# Patient Record
Sex: Male | Born: 1945 | Race: White | Hispanic: No | Marital: Married | Smoking: Never smoker
Health system: Southern US, Community
[De-identification: ages and names within clinical notes are randomized; demographics above are authoritative.]

## PROBLEM LIST (undated history)

## (undated) DIAGNOSIS — IMO0002 Reserved for concepts with insufficient information to code with codable children: Secondary | ICD-10-CM

## (undated) DIAGNOSIS — F32A Depression, unspecified: Secondary | ICD-10-CM

## (undated) DIAGNOSIS — E785 Hyperlipidemia, unspecified: Secondary | ICD-10-CM

## (undated) DIAGNOSIS — I779 Disorder of arteries and arterioles, unspecified: Secondary | ICD-10-CM

## (undated) DIAGNOSIS — F329 Major depressive disorder, single episode, unspecified: Secondary | ICD-10-CM

## (undated) DIAGNOSIS — I2510 Atherosclerotic heart disease of native coronary artery without angina pectoris: Secondary | ICD-10-CM

## (undated) DIAGNOSIS — R9430 Abnormal result of cardiovascular function study, unspecified: Secondary | ICD-10-CM

## (undated) DIAGNOSIS — I739 Peripheral vascular disease, unspecified: Secondary | ICD-10-CM

## (undated) DIAGNOSIS — I358 Other nonrheumatic aortic valve disorders: Secondary | ICD-10-CM

## (undated) HISTORY — DX: Peripheral vascular disease, unspecified: I73.9

## (undated) HISTORY — DX: Major depressive disorder, single episode, unspecified: F32.9

## (undated) HISTORY — DX: Other nonrheumatic aortic valve disorders: I35.8

## (undated) HISTORY — PX: CORONARY STENT PLACEMENT: SHX1402

## (undated) HISTORY — DX: Disorder of arteries and arterioles, unspecified: I77.9

## (undated) HISTORY — DX: Reserved for concepts with insufficient information to code with codable children: IMO0002

## (undated) HISTORY — DX: Depression, unspecified: F32.A

## (undated) HISTORY — DX: Hyperlipidemia, unspecified: E78.5

## (undated) HISTORY — DX: Atherosclerotic heart disease of native coronary artery without angina pectoris: I25.10

## (undated) HISTORY — PX: CARDIAC CATHETERIZATION: SHX172

## (undated) HISTORY — DX: Abnormal result of cardiovascular function study, unspecified: R94.30

---

## 2000-09-21 ENCOUNTER — Other Ambulatory Visit: Admission: RE | Admit: 2000-09-21 | Discharge: 2000-09-21 | Payer: Self-pay | Admitting: Dermatology

## 2000-10-07 ENCOUNTER — Encounter: Payer: Self-pay | Admitting: *Deleted

## 2000-10-07 ENCOUNTER — Ambulatory Visit (HOSPITAL_COMMUNITY): Admission: RE | Admit: 2000-10-07 | Discharge: 2000-10-07 | Payer: Self-pay | Admitting: *Deleted

## 2001-11-02 ENCOUNTER — Ambulatory Visit (HOSPITAL_COMMUNITY): Admission: RE | Admit: 2001-11-02 | Discharge: 2001-11-02 | Payer: Self-pay | Admitting: General Surgery

## 2002-11-27 ENCOUNTER — Encounter: Payer: Self-pay | Admitting: Internal Medicine

## 2002-11-27 ENCOUNTER — Emergency Department (HOSPITAL_COMMUNITY): Admission: EM | Admit: 2002-11-27 | Discharge: 2002-11-27 | Payer: Self-pay | Admitting: Internal Medicine

## 2003-09-06 ENCOUNTER — Inpatient Hospital Stay (HOSPITAL_COMMUNITY): Admission: RE | Admit: 2003-09-06 | Discharge: 2003-09-08 | Payer: Self-pay | Admitting: *Deleted

## 2003-11-21 ENCOUNTER — Ambulatory Visit (HOSPITAL_COMMUNITY): Admission: RE | Admit: 2003-11-21 | Discharge: 2003-11-21 | Payer: Self-pay

## 2004-07-09 ENCOUNTER — Ambulatory Visit: Payer: Self-pay | Admitting: Cardiology

## 2005-06-16 HISTORY — PX: HIP SURGERY: SHX245

## 2005-06-16 HISTORY — PX: ROTATOR CUFF REPAIR: SHX139

## 2005-06-19 ENCOUNTER — Ambulatory Visit: Payer: Self-pay | Admitting: Internal Medicine

## 2005-06-24 ENCOUNTER — Ambulatory Visit: Payer: Self-pay

## 2005-06-24 ENCOUNTER — Encounter: Payer: Self-pay | Admitting: Cardiology

## 2005-06-27 ENCOUNTER — Ambulatory Visit: Payer: Self-pay | Admitting: Cardiology

## 2005-06-30 ENCOUNTER — Ambulatory Visit (HOSPITAL_COMMUNITY): Admission: RE | Admit: 2005-06-30 | Discharge: 2005-06-30 | Payer: Self-pay | Admitting: Cardiology

## 2005-06-30 ENCOUNTER — Encounter: Payer: Self-pay | Admitting: Cardiology

## 2005-06-30 ENCOUNTER — Ambulatory Visit: Payer: Self-pay | Admitting: Cardiology

## 2005-07-04 ENCOUNTER — Inpatient Hospital Stay (HOSPITAL_COMMUNITY): Admission: RE | Admit: 2005-07-04 | Discharge: 2005-07-06 | Payer: Self-pay | Admitting: Orthopaedic Surgery

## 2005-08-25 ENCOUNTER — Ambulatory Visit: Payer: Self-pay | Admitting: Cardiology

## 2005-11-18 ENCOUNTER — Ambulatory Visit (HOSPITAL_COMMUNITY): Admission: RE | Admit: 2005-11-18 | Discharge: 2005-11-18 | Payer: Self-pay | Admitting: Family Medicine

## 2006-04-14 ENCOUNTER — Ambulatory Visit (HOSPITAL_COMMUNITY): Admission: RE | Admit: 2006-04-14 | Discharge: 2006-04-14 | Payer: Self-pay | Admitting: Family Medicine

## 2006-09-01 ENCOUNTER — Ambulatory Visit: Payer: Self-pay | Admitting: Cardiology

## 2007-06-27 IMAGING — CR DG CHEST 2V
2 series · 2 of 2 positions shown · non-contrast
Comparison: None.

CLINICAL DATA: Pre-admit for left hip surgery.
 CHEST - 2 VIEW:

[view not recorded (1 of 2)]
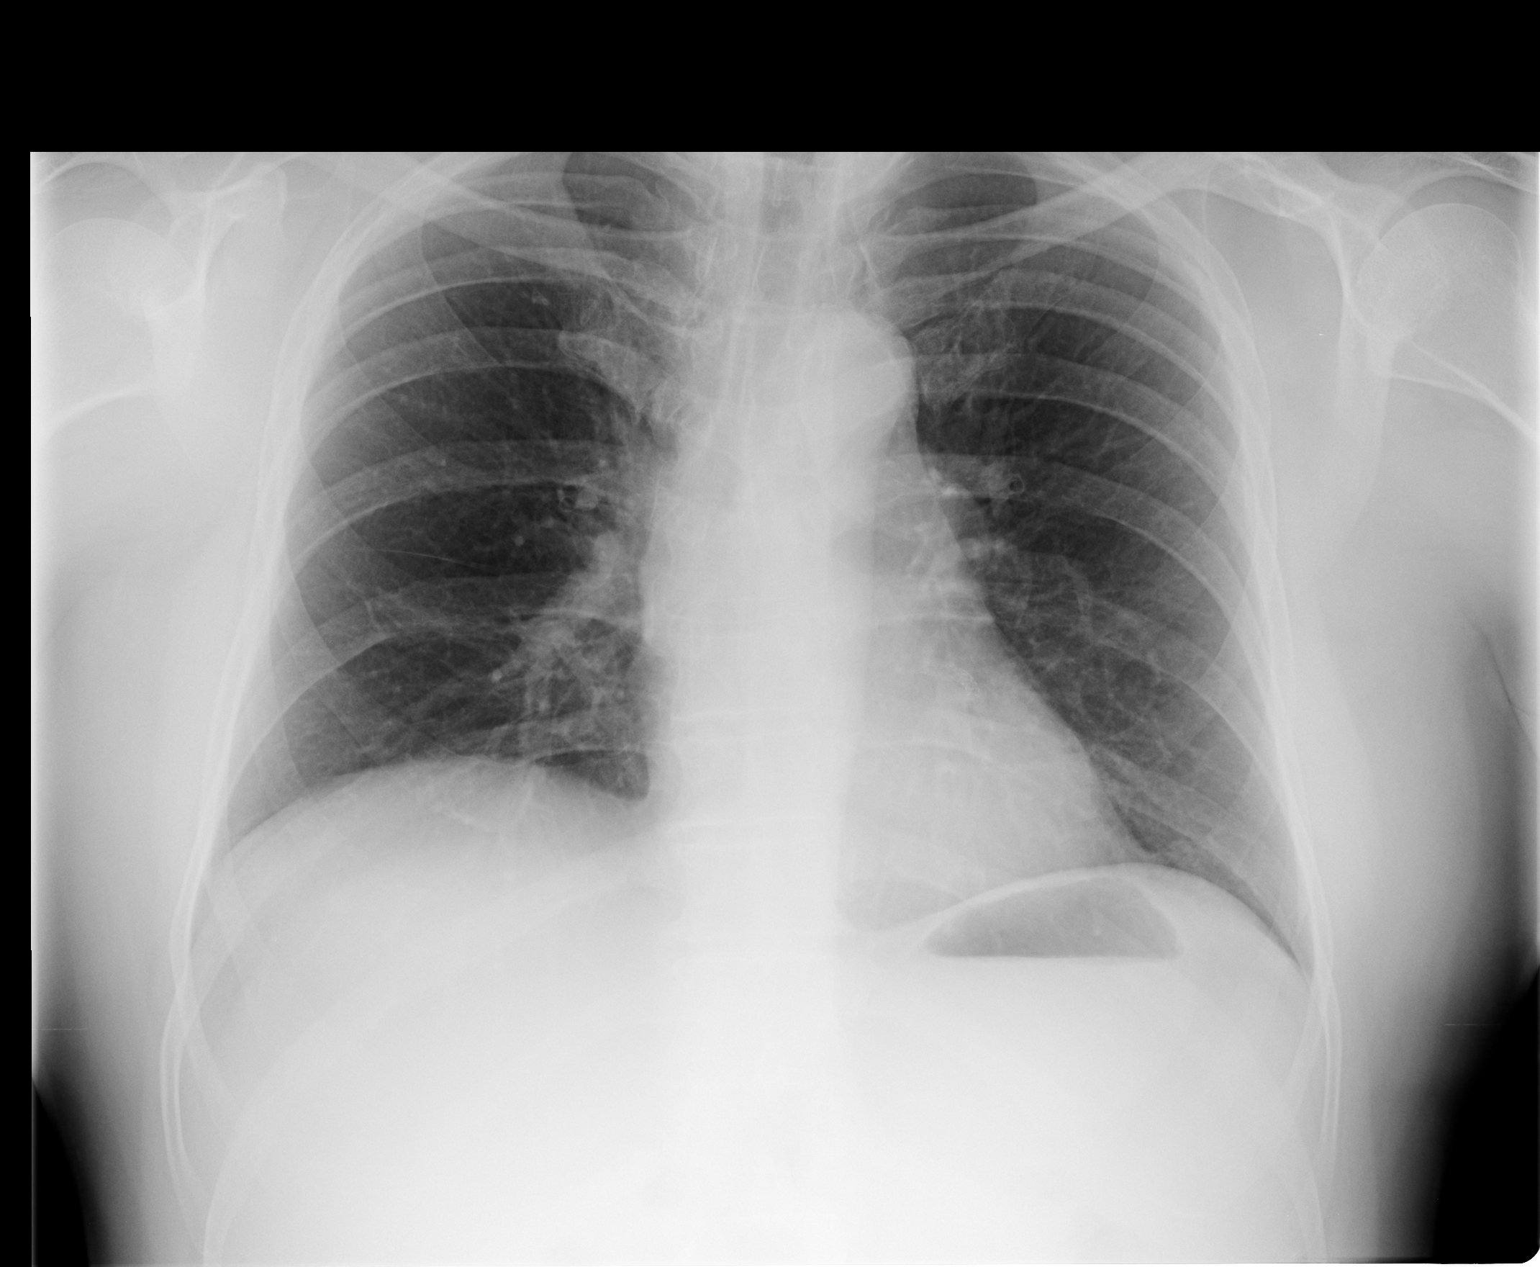

[view not recorded (2 of 2)]
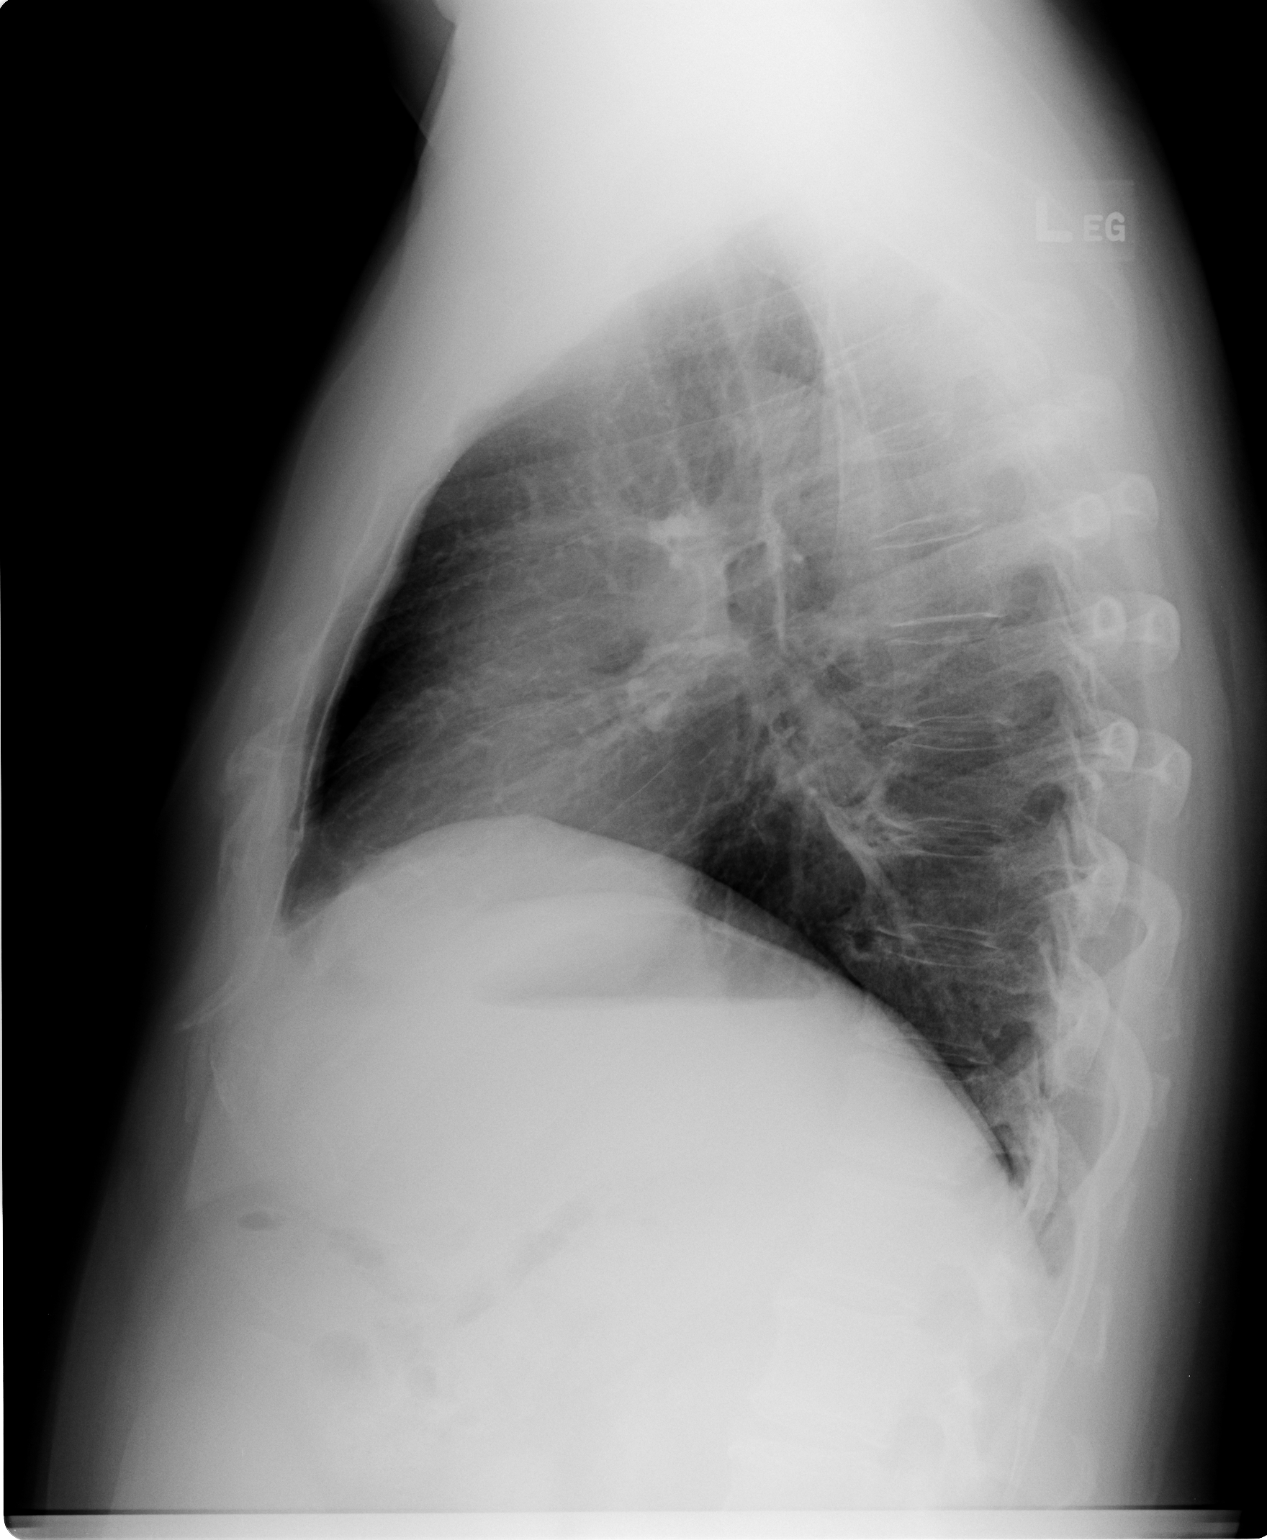

[2 of 2 positions shown; findings below may reference images not displayed]

FINDINGS: Heart normal.   There is some chronic bronchitic-type change.  There is also a left coronary artery stent.  No active disease.
IMPRESSION: 1. Chronic changes ? no acute process.
 2. Left coronary artery stent.

## 2007-10-18 ENCOUNTER — Ambulatory Visit: Payer: Self-pay | Admitting: Cardiology

## 2008-02-04 ENCOUNTER — Emergency Department (HOSPITAL_COMMUNITY): Admission: EM | Admit: 2008-02-04 | Discharge: 2008-02-05 | Payer: Self-pay | Admitting: Emergency Medicine

## 2008-02-04 ENCOUNTER — Ambulatory Visit: Payer: Self-pay | Admitting: Internal Medicine

## 2008-04-09 IMAGING — US US CAROTID DUPLEX BILAT
1 series · 14 of 24 positions shown · non-contrast
Comparison: none

HISTORY: Carotid bruit, murmur, vertigo, coronary artery disease status post MI

[Series 1: unknown · 0.07mm/px · 14 of 46 slices shown]
[im 1/46]
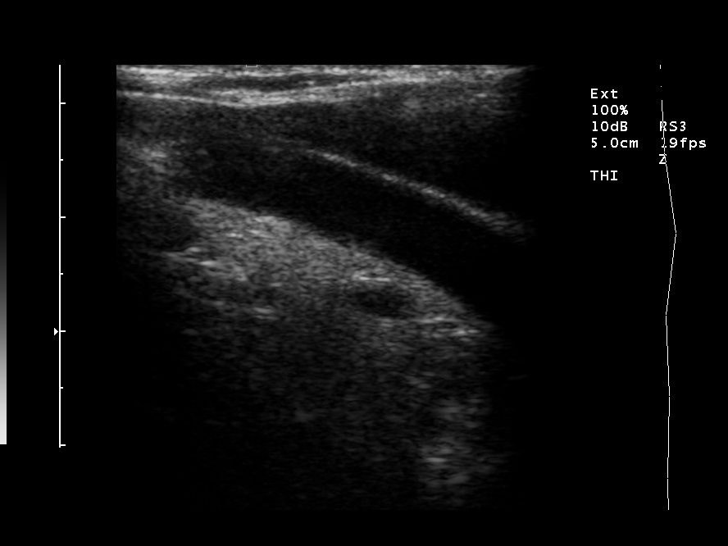
[im 4/46]
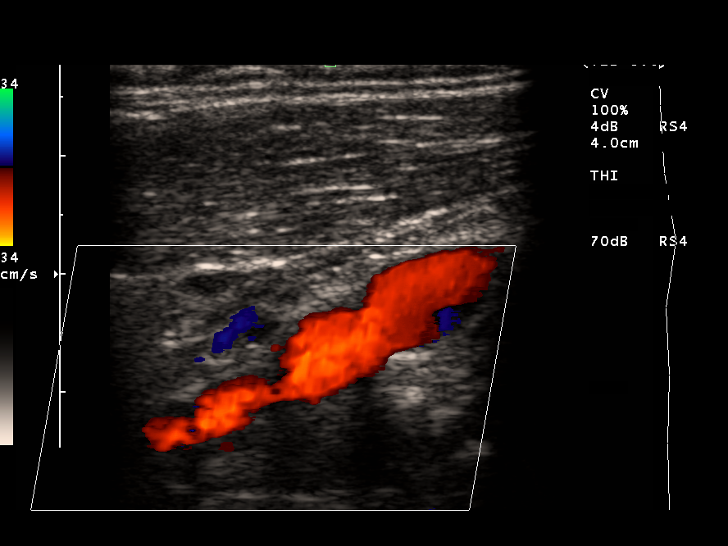
[im 8/46]
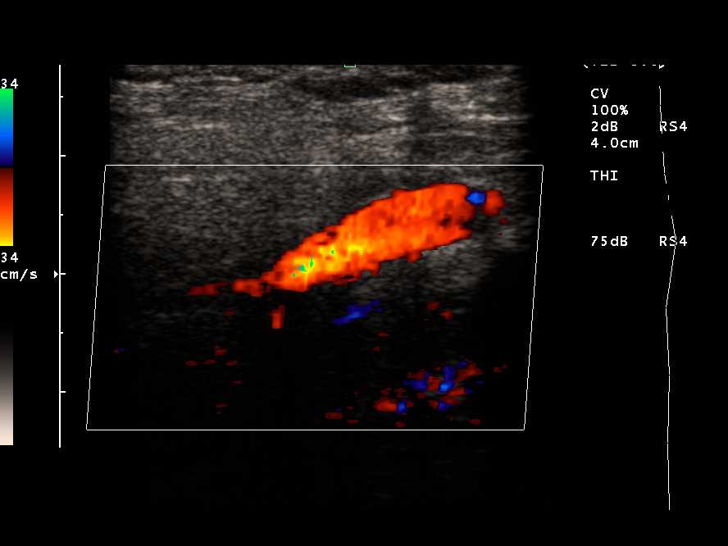
[im 12/46]
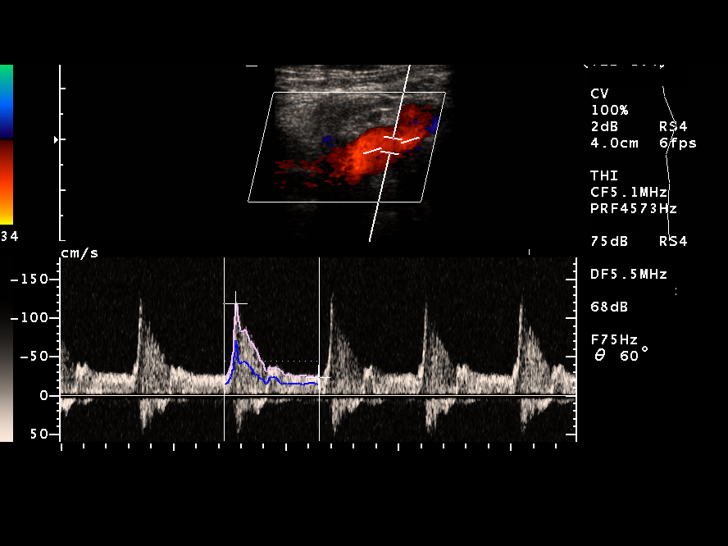
[im 14/46]
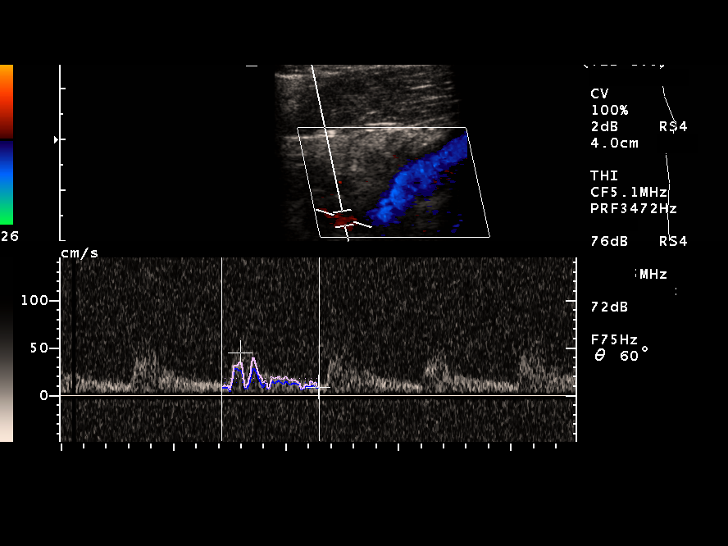
[im 18/46]
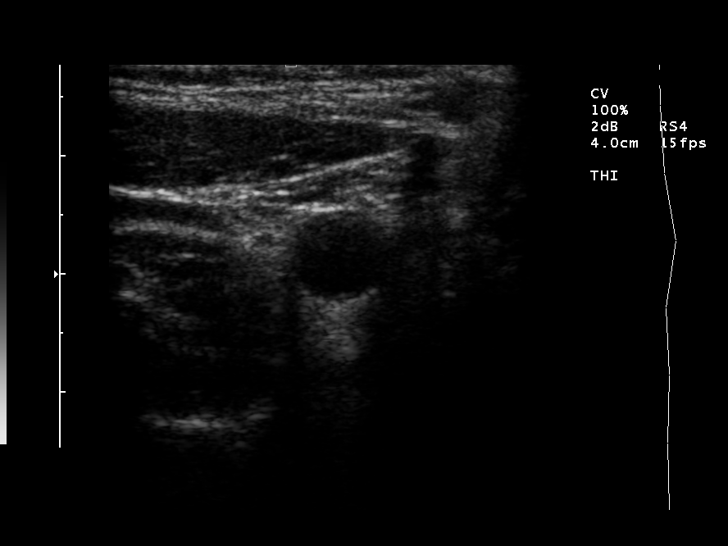
[im 22/46]
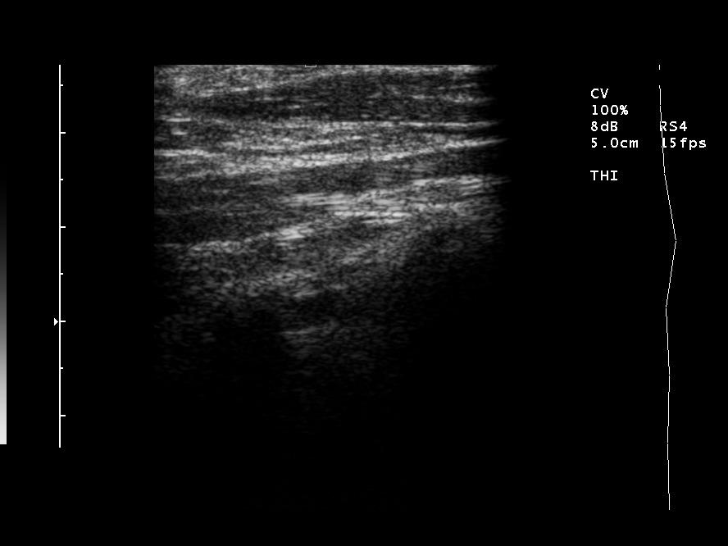
[im 24/46]
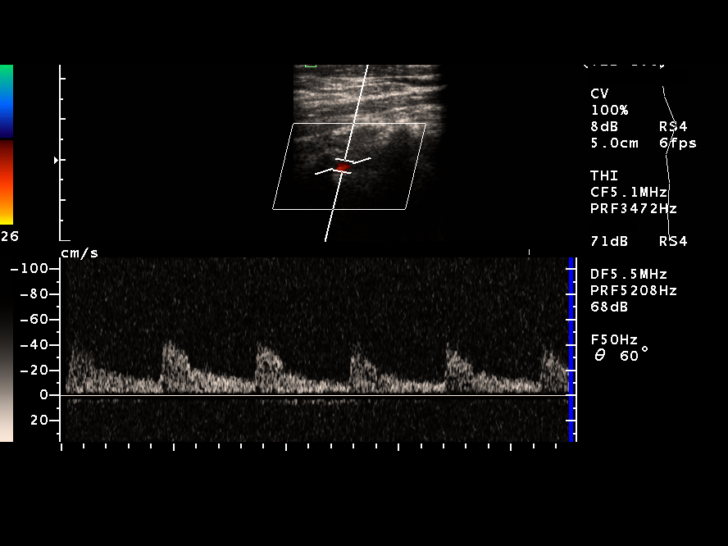
[im 28/46]
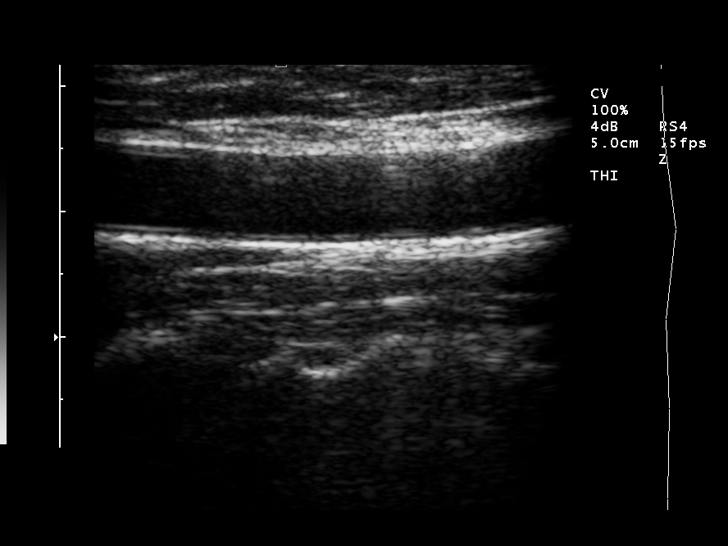
[im 32/46]
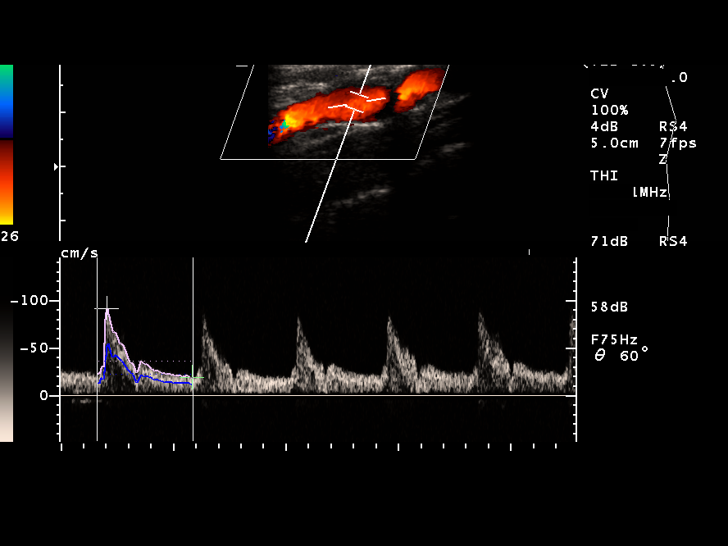
[im 36/46]
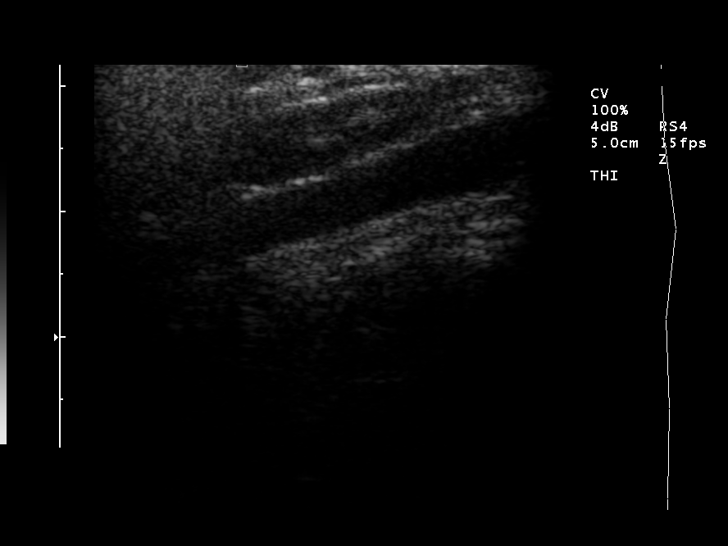
[im 38/46]
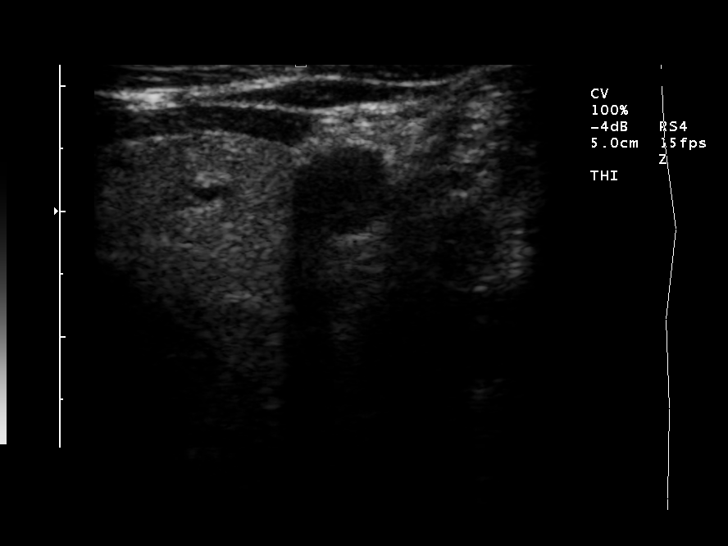
[im 42/46]
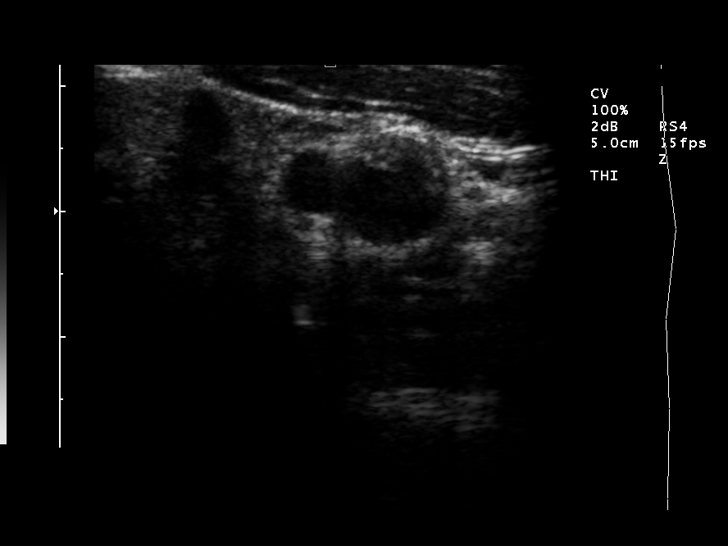
[im 46/46]
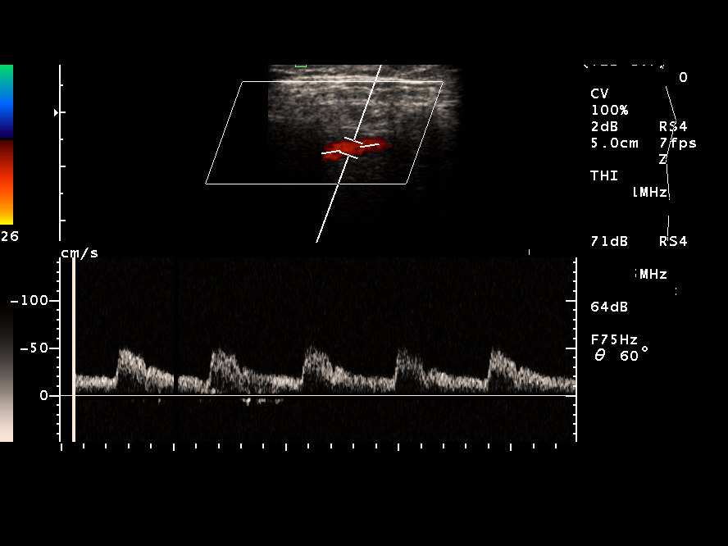

[14 of 24 positions shown; findings below may reference images not displayed]

ULTRASOUND CAROTID DUPLEX BILATERAL:

Gray scale, color Doppler, and pulse wave imaging of carotid systems performed
bilaterally. 

Intimal thickening right common carotid artery.
Plaque formation at left carotid bifurcation.
Mildly turbulent flow on color Doppler imaging and wave form analysis right
carotid bulb and bilateral ICA.
Following peak systolic velocities obtained, in cm per second:

Right CCA 78
Right ICA 118
Right ECA 102
Right ICA/CCA ratio
Right ICA EDV 23

Left CCA 66
Left ICA 91
Left ECA 89
Left ICA/CCA ratio
Left ICA EDV 19

Antegrade flow present bilateral vertebral arteries.
IMPRESSION: Intimal thickening right common carotid artery.
Plaque formation left carotid bulb and bifurcation.
Scattered turbulent flow without evidence of hemodynamically significant
stenosis.

## 2008-05-16 ENCOUNTER — Ambulatory Visit: Payer: Self-pay | Admitting: Cardiology

## 2008-09-04 ENCOUNTER — Ambulatory Visit: Payer: Self-pay | Admitting: Psychology

## 2008-12-22 ENCOUNTER — Encounter: Payer: Self-pay | Admitting: Cardiology

## 2008-12-25 ENCOUNTER — Encounter: Payer: Self-pay | Admitting: Cardiology

## 2008-12-25 ENCOUNTER — Ambulatory Visit: Payer: Self-pay

## 2008-12-25 ENCOUNTER — Ambulatory Visit: Payer: Self-pay | Admitting: Cardiology

## 2009-10-31 ENCOUNTER — Encounter (INDEPENDENT_AMBULATORY_CARE_PROVIDER_SITE_OTHER): Payer: Self-pay | Admitting: *Deleted

## 2010-02-28 ENCOUNTER — Ambulatory Visit: Payer: Self-pay | Admitting: Cardiology

## 2010-04-16 HISTORY — PX: MELANOMA EXCISION: SHX5266

## 2010-07-07 ENCOUNTER — Encounter: Payer: Self-pay | Admitting: Family Medicine

## 2010-07-16 NOTE — Letter (Signed)
Summary: Appointment - Reminder 2  [facility], Main Office  [address]   [place], [place] [address]   Phone: [phone]  Fax: [phone]     Oct 31, 2009 MRN: [id number]   Brandon Macias [address] [place], [place]  [address]   Dear Mr. Kruger,  Our records indicate that it is time to schedule a follow-up appointment with Dr. Myrtis Ser. It is very important that we reach you to schedule this appointment. We look forward to participating in your health care needs. Please contact us at the number listed above at your earliest convenience to schedule your appointment.  If you are unable to make an appointment at this time, give [place] a call so we can update our records.     Sincerely,   Migdalia Dk [facility] Scheduling Team

## 2010-07-16 NOTE — Assessment & Plan Note (Signed)
Summary: past due fu due injuly/mt   Visit Type:  Follow-up Primary Provider:  Assunta Found, MD  CC:  CAD.  History of Present Illness: The patient is seen for followup of coronary artery disease.  I saw him last July, 2010.  He is doing very well.  He's not had any significant chest pain.  He had some mild dizziness recently.  We discussed this carefully and it does not sound like cardiac ischemia.  He's going to full activities.  His diabetes has been out of control and he is working very carefully with Dr.Golding to get this back under control.  He is eating better and exercising more.  Current Medications (verified): 1)  Crestor 40 Mg Tabs (Rosuvastatin Calcium) .... Take One Tablet By Mouth Daily. 2)  Janumet 50-500 Mg Tabs (Sitagliptin-Metformin Hcl) .... Two Times A Day 3)  Mirtazapine 15 Mg Tabs (Mirtazapine) .... Take One Tablet By Mouth Once Daily. 4)  Trazodone Hcl 100 Mg Tabs (Trazodone Hcl) .... Take One Tablet By Mouth Once Daily. 5)  Aspirin 81 Mg Tbec (Aspirin) .... Take One Tablet By Mouth Daily 6)  Cozaar 100 Mg Tabs (Losartan Potassium) .... Take One Tablet By Mouth Daily  Allergies (verified): No Known Drug Allergies  Past History:  Past Medical History: DM Hyperlipidemia Hip surgery and redo hip in January of 2007 Normal left ventricular function. CAD...  tandem drug-eluting stents to the LAD March 2005. Jailed diagonal at that time with a small controlled MI. Repeat catheterization January 2007.. old occluded diagonal. Stents were patent.(Myoview scan at that time revealed some anterior ischemia..from the old diagonal). Aortic valve sclerosis .Marland Kitchen2D 12/25/2008. Marland Kitchen.EF 60%.  mild aortic valve sclerosis .Marland Kitchen..echo... July, 2010  Review of Systems       Patient denies fever, chills, headache, sweats, rash, change in vision, change in hearing, chest pain, cough, nausea vomiting, urinary symptoms.  All other systems are reviewed and are negative.  Vital  Signs:  Patient profile:   65 year old male Height:      68 inches Weight:      189 pounds BMI:     28.84 Pulse rate:   67 / minute BP sitting:   108 / 76  (left arm) Cuff size:   regular  Vitals Entered By: Hardin Negus, RMA (February 28, 2010 8:59 AM)  Physical Exam  General:  patient is stable in general. Eyes:  no xanthelasma. Neck:  no jugular venous distention. Lungs:  lungs are clear respiratory effort is not labored. Heart:  cardiac exam reveals S1-S2.  There is a 2/6 systolic murmur. Abdomen:  abdomen is soft. Extremities:  no peripheral edema. Psych:  patient is oriented to person time and place.  Affect is normal.   Impression & Recommendations:  Problem # 1:  * AORTIC VALVE SCLEROSIS His  aortic valve sclerosis is mild by echo in July, 2010.  Problem # 2:  CAD (ICD-414.00)  His updated medication list for this problem includes:    Aspirin 81 Mg Tbec (Aspirin) .Marland Kitchen... Take one tablet by mouth daily  Orders: EKG w/ Interpretation (93000) Coronary disease stable.  EKG is done today and reviewed by me.  There is no significant abnormality.  No further workup.  Treating his diabetes aggressively will be the most important thing going forward.  Problem # 3:  HYPERLIPIDEMIA (ICD-272.4)  His updated medication list for this problem includes:    Crestor 40 Mg Tabs (Rosuvastatin calcium) .Marland Kitchen... Take one tablet by mouth daily. Lipids are  being treated.  No change in therapy.  Followup in one year.  Patient Instructions: 1)  Your physician wants you to follow-up in:  1 year.  You will receive a reminder letter in the mail two months in advance. If you don't receive a letter, please call our office to schedule the follow-up appointment.

## 2010-10-29 NOTE — Assessment & Plan Note (Signed)
[facility]                            CARDIOLOGY OFFICE NOTE   GIRARD, KOONTZ                        MRN:          [id number]  DATE:10/18/2007                            DOB:          1946-03-26    Mr. Zunker continues to do very well.  He had an intervention to his LAD  in 2005.  In 2007, he was cleared of for his hip surgery.  We had  decided at that time, after careful consideration, for him to undergo  cath by Dr. Juanda Chance in January of 2007.  He has tandem drug-eluting  stents to the LAD and there is a jailed diagonal.  We know this  historically.  Dr. Regino Schultze study showed the occluded diagonal and that  the stents were widely patent.  He has now stopped his Plavix since last  year.  He had been on Plavix for 3 years.  We know that his Myoview  still shows some mild anterior ischemia and this could be from the  occluded diagonal.  He has not had a Myoview since 2007.  The patient  also has mild aortic valve sclerosis.  He has not had an echo for at  least 3 to 4 years if not longer and we will arrange for him to have an  echo before I see him next year.   PAST MEDICAL HISTORY:   ALLERGIES:  No known drug allergies.   MEDICATIONS:  1. Vytorin 10/40.  2. Aspirin 81.  3. Metformin 500 b.i.d.   OTHER MEDICAL PROBLEMS:  See the list below.   REVIEW OF SYSTEMS:  He is feeling well.  He is not having any headache  or eye problems.  He has no GI or GU symptoms.  He has been with his  girlfriend 14 years and they have become engaged to be married on  March 25, 2008.   Otherwise review of systems is negative.   PHYSICAL EXAM:  Blood pressure is 128/88 with a pulse of 71.  The patient is oriented to person, time and place.  Affect is normal.  HEENT:  Reveals no xanthelasma.  He has normal extraocular motion.  There are no carotid bruits.  There is no jugular venous distention.  Lungs are clear.  Respiratory effort is not labored.  CARDIAC:   Exam reveals S1 with an S2.  The patient has a 2/6 crescendo-  decrescendo systolic murmur.  The abdomen is soft.  There are no masses or bruits.  He has no peripheral edema.   EKG is normal.   PROBLEMS:  1. Borderline diabetes.  The patient now is on metformin.  2. Abnormal cholesterol on Vytorin.  We will ask Dr. Phillips Odor for a      copy of his labs.  3. Status post hip surgery and redo hip in January of 2007 and he is      doing very well.  4. Normal left ventricular function.  5. Coronary disease.  See the discussion above.  6. Aortic valve sclerosis and he will have an echo before I see him in  the spring of 2010.   He is doing well.     Luis Abed, MD, [facility]  Electronically Signed    JDK/MedQ  DD: 10/18/2007  DT: 10/18/2007  Job #: [id number]   cc:   Corrie Mckusick, M.D.

## 2010-10-29 NOTE — Consult Note (Signed)
NAMEJANCARLOS, Brandon Macias   MEDICAL RECORD NO.:  [id number]          PATIENT TYPE:  EMS   LOCATION:  [facility]                         FACILITY:  [facility]   PHYSICIAN:  Bevelyn Buckles. Bensimhon, MDDATE OF BIRTH:  [birth year]   DATE OF CONSULTATION:  02/04/2008  DATE OF DISCHARGE:                                 CONSULTATION   PRIMARY CARE PHYSICIAN:  Corrie Mckusick, MD   CARDIOLOGIST:  Everardo Beals. Juanda Chance, MD, [facility]   REASON FOR CONSULTATION:  Dizziness and tingling.   Mr. Brandon Macias is a very pleasant 65 year old male.  His fiancee actually  works with Dr. Tora Perches brother.  Mr. Brandon Macias has a history of  hyperlipidemia, borderline diabetes, and coronary artery disease.  He is  status post tandem overlapping Cypher drug-eluting stent to the LAD in  March 2005 by Dr. Gerri Spore.  This procedure was complicated by a side  branch occlusion of the diagonal resulting in a small periprocedural  infarct.  His anginal symptoms at that time were mostly dyspnea.  In  January 2007, he underwent repeat catheterization due to an abnormal  Myoview that showed a widely patent LAD stent.  The diagonal was totally  occluded with some collaterals.  Otherwise, left circumflex and right  coronary artery were perfectly normal.   He has been in his normal state of health until about 2 weeks ago when  he started to develop dizziness and weakness.  This is sort of off and  on to the past 2 weeks.  He says he has been under an incredible amount  of stress at work.  Today, he was out walking with his fiancee and felt  very dizzy and had a stop.  He denied any chest pain or shortness of  breath.  He has not had any change in his exercise tolerance.  About an  hour later, he was sitting on a couch and he developed some nausea and  became very jittery, so Dr. Myrtis Ser was contacted and he came to the ER for  me to evaluate.   He did say that he does feel swimmy headed, but denies any syncope.  He  has  not had any significant visual changes.  He has not noticed any  muscle weakness or facial droop.  There has been no aphasia or other  problems with his speech.  He has not had any headaches or focal  weakness.   Remainder of review of systems is notable for insomnia and arthritis  pain.  All other systems are negative except for HPI and problem list.   PROBLEM LIST:  1. Coronary artery disease.      a.     Status post overlapping Cypher drug-eluting stent to left       anterior descending artery in March 2005 complicated by a side       branch occlusion and small periprocedural infarct due to loss of a       diagonal branch.  He has normal left ventricular function.  2. Hyperlipidemia.  3. Borderline diabetes, this has improved since he  lost weight.  4. Intention tremor.  5. Insomnia.   MEDICATIONS:  1. Vytorin 10/40.  2. Aspirin 81.  3. Ambien p.r.n.   ALLERGIES:  No known drug allergies.   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  He is engaged to be married.  He does not  smoke.   FAMILY HISTORY:  Noncontributory.   PHYSICAL EXAMINATION:  GENERAL:  He is somewhat jittery and nervous-  appearing.  He is in no acute distress.  VITAL SIGNS:  Blood pressure is 142/78, heart rate 68.  His saturations  are in the high 90s.   HEENT:  Normal.  NECK:  Supple.  There is no JVD.  Carotids are 2+ bilaterally without  bruits.  There is no lymphadenopathy or thyromegaly.  CARDIAC:  PMI is nondisplaced.  He is regular with 2/6 systolic ejection  murmur at the right sternal border.  S2 is well preserved.  LUNGS:  Clear.  ABDOMEN:  Soft, nontender, nondistended.  No hepatosplenomegaly.  No  bruits.  No mass.  EXTREMITIES:  Warm with no cyanosis, clubbing, or edema.  No rash.  NEURO:  He is alert and oriented x3.  Cranial nerves II through XII are  grossly intact.  Moves all four extremities without difficulty.  There  is no  pronator drift.  He does have a mild intention tremor, which is  chronic on finger-to-nose exam.  It is otherwise unremarkable.  He has a  negative Romberg.  Affect is mildly stressed.   Labs and EKG are pending.   ASSESSMENT:  1. Dizziness, weakness, and nausea.  2. History of coronary artery disease as described above.  3. Borderline diabetes.   PLAN/DISCUSSION:  I suspect this is a stress reaction, but I do think it  is reasonable to check baseline labs and make sure there is nothing  wrong with his glucose or thyroid.  We will also check an EKG and give  him one dose of Xanax here in the ER.  If all the tests are negative, we  will likely discharge him with anxiolytic, so that he can follow up with  his primary care physician.  We did briefly discussed the possibility of  a head CT, but we have agreed that this is probably not necessary at  this point unless his symptoms persist.  We will wait for the results of  his labs.      Bevelyn Buckles. Bensimhon, MD  Electronically Signed     DRB/MEDQ  D:  02/04/2008  T:  02/05/2008  Job:  [id number]   cc:   Corrie Mckusick, M.D.

## 2010-10-29 NOTE — Assessment & Plan Note (Signed)
[facility]                            CARDIOLOGY OFFICE NOTE   KEATS, KINGRY                        MRN:          [id number]  DATE:05/16/2008                            DOB:          [birth year]    Mr. Friberg is doing well.  He had an intervention in 2005.  In 2007, we  evaluated him fully before he had some surgery.  He had a mild area of  anterior ischemia and we did proceed with catheterization.  His drug-  eluting stents were patent.  He had the old totally occluded jailed  diagonal.  This probably gives him some mild ischemia, but he has no  symptoms.  He had no other significant findings.  He has remained  stable.   Recently, he had a significant panic attack related to work in other  situations.  He was seen in the emergency room and this resolved and he  feels much better.  He is very appreciative that our team came to see  him immediately to help.   PAST MEDICAL HISTORY:   ALLERGIES:  No known drug allergies.   MEDICATIONS:  Vytorin, aspirin, Lunesta, and metformin.   OTHER MEDICAL PROBLEMS:  See the list below.   REVIEW OF SYSTEMS:  He is not having any GI or GU symptoms.  He does  have an upper respiratory infection at this time.  Otherwise, his review  of systems is negative.   PHYSICAL EXAMINATION:  VITAL SIGNS:  Blood pressure is 130/80 with a  pulse of 82.  GENERAL:  The patient is oriented to person, time and place.  Affect is  normal.  HEENT:  No xanthelasma.  He has normal extraocular motion.  NECK:  There are no carotid bruits.  There is no jugular venous  distention.  LUNGS:  Clear.  Respiratory effort is not labored.  CARDIAC:  An S1 with an S2.  There is a 2-3/6 systolic murmur.  ABDOMEN:  Soft.  EXTREMITIES:  He has no peripheral edema.   No labs were done today.   PROBLEMS:  1. Diabetes being treated.  2. Abnormal cholesterol being treated.  3. Status post hip surgery and redo hip surgery in January 2007,      stable.  4. Normal left ventricular function.  5. Coronary artery disease as outlined above with tandem stents in the      left anterior descending and a jailed diagonal that is old.      Residual mild inferior ischemia from the jailed diagonal.  6. Aortic valve sclerosis.  It is now time for followup echo to assess      his murmur and be sure      he is not developing significant aortic stenosis.  This will be      done and I will see him back in 6 months.     Luis Abed, MD, [facility]  Electronically Signed    JDK/MedQ  DD: 05/16/2008  DT: 05/17/2008  Job #: [id number]   cc:   Corrie Mckusick, M.D.

## 2010-10-29 NOTE — Consult Note (Signed)
Brandon Macias, Brandon Macias NO.:  [id number]   MEDICAL RECORD NO.:  [id number]          PATIENT TYPE:  EMS   LOCATION:  [facility]                         FACILITY:  [facility]   PHYSICIAN:  Bevelyn Buckles. Bensimhon, MDDATE OF BIRTH:  25-Aug-1945   DATE OF CONSULTATION:  02/04/2008  DATE OF DISCHARGE:                                 CONSULTATION   PRIMARY CARE PHYSICIAN:  Corrie Mckusick, M.D.   CARDIOLOGIST:  Luis Abed, MD, [facility] and actually now Everardo Beals.  Juanda Chance, MD, [facility].   REASON FOR CONSULTATION:  Dizziness and arm tingling.   HISTORY OF PRESENT ILLNESS:  Brandon Macias is a very pleasant 65 year old  male, whose fiancee works with Dr. Henrietta Macias brother.  He has a history of  hyperlipidemia, borderline diabetes and coronary artery disease.  He is  status post tandem overlapping Cypher drug-eluting stents to the LAD in  2005 by Dr. Gerri Spore.  This procedure was complicated by the jailing of  a diagonal branch and a periprocedural infarct.  He had a repeat  catheterization in January 2007, after an abnormal preoperative Myoview;  this showed patent LAD stents with the totally occluded diagonals, which  filled by collaterals.  The right coronary and left circumflex were  normal.  LV function was normal.   He says he was in his usual state of health until about 2 weeks ago,  when he began to develop dizziness, arm tingling and weakness.  He says  he has been under an incredible amount of stress at work.  He noticed  that his symptoms were a little bit worse today.  He was out walking  with his fiancee, when he became dizzy and had to stop.  About an hour  later he was sitting on the couch relaxing and developed some nausea,  but no chest pain or shortness of breath and no vomiting.  He felt very  jittery and so decided to come to the emergency room for evaluation.   REVIEW OF SYSTEMS:  He denies any change in his functional capacity or  exercise tolerance.  He has not had any focal  neurological symptoms.  He  denies any problems with his speech.  No problems swallowing.  No  problems with his strength.  He has not had any headaches.  No  significant visual changes.  No palpitations.  No syncope or presyncope.  No fevers or chills.  The remainder of the review of systems is as per  HPI and problem list, otherwise all systems were negative.   PROBLEM LIST:  1. Coronary artery disease.  Status post overlapping Cypher drug-      eluting stent to the LAD in 2005.  Relook catheterization in 2007      looked good.  2. Hyperlipidemia.  3. Intention tremor.  4. Borderline diabetes, which improves with weight loss.  5. Insomnia.   CURRENT MEDICATIONS:  1. Vytorin 10/40.  2. Aspirin 81.  3. Ambien.   ALLERGIES:  NO KNOWN DRUG ALLERGIES.   SOCIAL HISTORY:  He is engaged to be married.  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  He does not smoke  cigarettes.   FAMILY HISTORY:  Noncontributory.   PHYSICAL EXAMINATION:  He is mildly anxious.  Blood pressure is 142/98,  heart rate 68.  He is satting in the high 90s on room air.  HEENT:  Normal.  NECK:  Supple.  No JVD.  Carotids were 2+ bilateral without bruits.  There was no lymphadenopathy or thyromegaly.  CARDIAC:  PMI is nondisplaced.  He had a regular rate and rhythm with a  2/6 systolic ejection murmur at the right sternal border.  S2 is well-  preserved.  LUNGS:  Clear.  ABDOMEN:  Soft, nontender, nondistended.  No hepatosplenomegaly and no  bruits.  No masses appreciated.  Good bowel sounds.  EXTREMITIES:  Warm with no cyanosis, clubbing or edema.  No rash.  NEUROLOGIC:  He is alert and oriented x3.  Cranial nerves II-XII are  intact.  Moves all four extremities without difficulty.  His strength is  normal in all extremities.  Pronator drift is negative.  There is no  facial droop.  Romberg is negative.  He does have a mild intention  tremor on finger-to-nose examination,  which he says is chronic.  Affect  is, once again, mildly jittery.   EKG and labs are pending.   ASSESSMENT:  1. Dizziness, weakness and nausea.  2. History of coronary artery disease, as described above.   PLAN:  I suspect the symptoms are stress reaction.  However, given his  history, I do think it is reasonable to check baseline labs, including  his blood sugar, a TSH, electrolytes and a CBC.  We will also check an  EKG.  I have given him one dose of Xanax here in the ER.  If all his  labs are negative, we can consider discharging him home with a low dose  anxiolytic.  Of note, we did discuss the possibility of doing a head CT  here in the ER; but, given the lack of focal findings, we have decided  that this is probably not necessary at this time.  If his symptoms  persist he can get one as an outpatient.      Bevelyn Buckles. Bensimhon, MD  Electronically Signed     DRB/MEDQ  D:  02/04/2008  T:  02/04/2008  Job:  [id number]   cc:   Corrie Mckusick, M.D.  Luis Abed, MD, [facility]

## 2010-11-01 NOTE — Procedures (Signed)
NAMESTROTHER, EVERITT NO.:  [id number]   MEDICAL RECORD NO.:  [id number]          PATIENT TYPE:  OUT   LOCATION:  RAD                            FACILITY:   PHYSICIAN:  Dani Gobble, MD       DATE OF BIRTH:  October 08, 1945   DATE OF PROCEDURE:  04/15/2006  DATE OF DISCHARGE:                                  ECHOCARDIOGRAM   REFERRING:  Dr. Phillips Odor.   INDICATIONS:  A 65 year old gentleman with vertigo and a carotid bruit who  is referred for cardiac murmur.   Technical quality of the study is somewhat limited secondary to patient body  habitus and poor acoustic windows.   The aorta appears normal in size, measured at 3.8 cm.   The left atrium also is measured normally at 3.6 cm.  The patient appeared  to be in sinus rhythm during the procedure.   The interventricular septum and posterior wall were mild to moderately  thickened.   The aortic valve is probably trileaflet, although the left coronary cusp is  not well visualized.  The leaflets appear mild to moderately thickened and  calcified with minimal limitation to leaflet excursion.  No significant  aortic insufficiency is noted.  Doppler interrogation of the aortic valve  reveals peak velocity of 2.2 m per second corresponding to a peak gradient  of 20 mmHg and a mean gradient of 11 mmHg with an area by Doppler of 2.2  cm2.   The mitral valve appears mildly thickened without limitation of leaflet  excursion.  No obvious mitral valve prolapse is appreciated.  There does  appear to be a lax chordae with chordal SAM present.  The possibility of a  possible vegetation cannot be entirely excluded; however, the location and  appearance is more consistent with lax chordae.  Mild mitral regurgitation  is noted.  Doppler interrogation of the mitral valve is within normal  limits.   The pulmonic valve appears grossly structurally normal.   Tricuspid valve also appears grossly structurally normal although not  well  visualized.  Mild tricuspid regurgitation is noted.   The left ventricle appears grossly normal in size with the LVIDD measured at  3.5 cm and LVISD measured at 2.6 cm.  Overall left ventricular systolic  function is normal.  In one view only, there appeared to be some  dyssynchrony, but this was not appreciated in any other view.  The overall  ejection fraction is normal.   The right atrium appears normal in size.  The right ventricle appears  moderately dilated with preserved right ventricular systolic function.  I  cannot exclude the possibility of a small anterior pericardial effusion;  however, this is more likely pericardial fat pad.  No hemodynamic compromise  is appreciated.   IMPRESSION:  1. Mild to moderate concentric left ventricular hypertrophy.  2. Aortic sclerosis with very mild aortic stenosis is present.  3. Mildly thickened mitral valve with lax chordae and chordal SAM:  Doubt      vegetation given location and appearance  4. Mild mitral and tricuspid regurgitation.  5.  Normal left ventricular size and overall ejection fraction.  In one      view only, there was some suggestion of dyssynchrony; however, this was      not appreciated in any other view, and overall ejection fraction is      normal.  6. Mild to moderate right ventricular enlargement with preserved right      ventricular systolic function.  7. Small anterior echo-free space which likely represents precardial fat      pad, although a small pericardial effusion without evidence of      hemodynamic compromise can not be entirely excluded.           ______________________________  Dani Gobble, MD     AB/MEDQ  D:  04/15/2006  T:  04/16/2006  Job:  [id number]

## 2010-11-01 NOTE — Cardiovascular Report (Signed)
Brandon Macias, Brandon Macias NO.:  [id number]   MEDICAL RECORD NO.:  [id number]          PATIENT TYPE:  OIB   LOCATION:  2899                         FACILITY:  [facility]   PHYSICIAN:  Charlies Constable, M.D. [facility] DATE OF BIRTH:  03-09-1946   DATE OF PROCEDURE:  DATE OF DISCHARGE:                              CARDIAC CATHETERIZATION   MEDICAL HISTORY:  Mr. Ferreras is 65 years old and works for a cigarette company  in [place].  He had a tandem overlying Cypher stent placed in the LAD in  March of 2005 by Dr. Gerri Spore.  The procedure was complicated by its side  branch occlusion which resulted in a small periprocedural  infarct.  He is  doing quite well since that time and he had a Cardiolite scan done recently  to evaluate him preoperatively for revision hip surgery.  This suggested  anterior ischemia and Dr. Myrtis Ser brought him in for evaluation of angiography.   PROCEDURE:  The procedure was performed via the right femoral artery and  arterial sheath in using a 6-French preformed coronary catheters.  A front  wall arterial puncture was prominent and opaque contrast was used.  We used  an AL-1 for injection of the right coronary since it had a high anterior  takeoff.  The right femoral artery was closed with __________  end of the  procedure.  The patient tolerated the procedure well and left the laboratory  in satisfactory condition.   RESULTS:  The left main coronary was free of disease.   The left anterior descending artery gave rise to 3 perforators.  There were  tandem overlying Cypher stents in the proximal LAD which had 0% stenosis.  There was a diagonal which arose from the LAD at the site of the stent which  was totally occluded and a small caliber vessel filled by collaterals from a  ramus branch.   The circumflex artery __________  gave rise to a ramus branch, a marginal  branch, a posterolateral branch and a posterior descending branch.  These  vessels were free of  significant disease.   The right coronary artery had a small __________  right ventricular  branches.  These vessels were free of significant disease.   The left ventricular and __________  coronary artery objection showed good  wall motion with no areas of hypokinesis, with an estimated ejection  fraction of 60%.   CONCLUSION:  Coronary artery status post prior percutaneous coronary  interventions as described above with 0% stenosis at the stent sites in the  proximal LAD, total occlusion on the first diagonal branch of the LAD, no  significant obstruction of the circumflex and the right coronary artery and  normal LV function.   RECOMMENDATIONS:  The patient has an occluded diagonal branch which is old  and occurred at the time of the previous intervention.  This probably  accounts for his abnormal scan.  This is not causing any clinical symptoms.  I do not think any further treatment is needed for this.  I think the  patient is okay to proceed with hip  revision surgery on Friday.  I spoke  with Dr. Ophelia Charter and he is okay with our leaving the patient on Plavix and  aspirin through the  surgery.  We will also begin the patient on Toprol XL 25 mg a day prior to  surgery and continue this for at least several days after the surgery.  This  prescription was given to the patient by Dr. Myrtis Ser.   The patient does not have valvular heart disease and should not need FB  prophylaxis.           ______________________________  Charlies Constable, M.D. [facility]     BB/MEDQ  D:  06/30/2005  T:  06/30/2005  Job:  [id number]   cc:   Loraine Leriche C. Ophelia Charter, M.D.  Fax: [phone]   Willa Rough, M.D.  [address]  [address]  [place]  [place] [address]   Corrie Mckusick, M.D.  Fax: [phone]

## 2010-11-01 NOTE — Discharge Summary (Signed)
NAMEDEMAREE, LIBERTO NO.:  [id number]   MEDICAL RECORD NO.:  [id number]          PATIENT TYPE:  INP   LOCATION:  5023                         FACILITY:  [facility]   PHYSICIAN:  Mark C. Ophelia Charter, M.D.    DATE OF BIRTH:  1945/12/19   DATE OF ADMISSION:  07/04/2005  DATE OF DISCHARGE:  07/06/2005                                 DISCHARGE SUMMARY   FINAL DIAGNOSES:  1.  Total hip arthroplasty, polyethylene wear with early loosening.  2.  New onset diabetes.   PROCEDURE:  Left total hip arthroplasty polyethylene and ball exchange.   SURGEON:  Mark C. Ophelia Charter, M.D.   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  Total hip was done in 1992 and he has been treated for the last  year or two with anti-inflammatories and Fosamax due to some polyethylene  debris and wear and now is having increased pain with radiographic evidence  of eccentric poly-wear and some early loosening.   ADMISSION MEDICATIONS:  1.  Plavix one a day.  2.  Vytorin 10/40 one p.o. daily.  3.  Voltaren 75 mg b.i.d.  4.  Aspirin 81 mg one p.o. daily.   HOSPITAL COURSE:  The patient was admitted and underwent polyethylene  exchange, liner exchange and change of the metal ball.  There was  significant polyethylene debris present.  New locking ring, new liner, new  metal head were placed.  Postoperatively x-rays looked good.  He made rapid  progress and was discharged home two days after surgery improved.   DISCHARGE MEDICATIONS:  1.  Plavix one a day.  2.  Vytorin 10/40 one p.o. daily.  3.  Voltaren 75 mg one b.i.d.  4.  Aspirin 81 mg one p.o. daily.  5.  Tylox for pain.  6.  He is on Coumadin daily which will be regulated for an INR of 2.0 to 3.0      and he will be on this for one month.   LABORATORY DATA:   Admission hemoglobin 11.4, discharge 10.6 and he did have  some elevated glucose at 159, 173.   PLAN:  His hyperglycemia will be evaluated by his primary care physician.  Copy of laboratory was given to him for review.  Hip wound cultures were  negative which were intraoperative.      Mark C. Ophelia Charter, M.D.  Electronically Signed     MCY/MEDQ  D:  08/08/2005  T:  08/08/2005  Job:  [id number]

## 2010-11-01 NOTE — Cardiovascular Report (Signed)
NAME:  Brandon Macias, Brandon Macias                           ACCOUNT NO.:  [id number]   MEDICAL RECORD NO.:  [id number]                   PATIENT TYPE:  OIB   LOCATION:  2903                                 FACILITY:  [facility]   PHYSICIAN:  Carole Binning, M.D. [facility]         DATE OF BIRTH:  [birth year]   DATE OF PROCEDURE:  09/06/2003  DATE OF DISCHARGE:                              CARDIAC CATHETERIZATION   PROCEDURES PERFORMED:  1. Left heart catheterization with coronary angiography and left     ventriculography.  2. Percutaneous transluminal coronary angioplasty with placement of drug     eluting stents times two in the proximal to mid left anterior descending     artery.   INDICATIONS:  Brandon Macias is a 65 year old male with multiple cardiac risk  factors.  He has been experiencing atypical chest pain.  A recent stress  Cardiolite scan showed significant ischemia in the anterior, anteroseptal  and apical region of the left ventricle.  He therefore is referred for  cardiac catheterization.   CATHETERIZATION PROCEDURAL NOTE:  A 6-French sheath was placed in the right  femoral artery.  Left coronary arteriography was performed with a 6-French  JL4 catheter.  The right coronary artery was a small non-dominant vessel,  which had a high anterior origin off of the aorta.  This was nonselectively  injected with an AR-2 catheter.  Left ventriculography was performed with an  angled pigtail catheter.  Contrast was Omnipaque.  There were no  complications.   CATHETERIZATION RESULTS:   HEMODYNAMICS:  1. Left ventricular pressure:  132/18.  2. Aortic pressure:  132/85.  3. There is no aortic valve gradient.   LEFT VENTRICULOGRAM:  Wall motion is normal.  Ejection fraction is estimated  at 65%.  There is no significant mitral regurgitation.   CORONARY ARTERIOGRAPHY (LEFT DOMINANT):  1. Left main is normal.  2. Left anterior descending artery has a calcified, tubular 90% stenosis in     the proximal  vessel.  This extends across the origin of a small to normal     sized diagonal branch and a septal perforator.  The LADs are otherwise     normal, supplying the anterior apical wall.  3. Left circumflex is a large dominant vessel.  Circumflex gives rise to a     normal sized first obtuse marginal, small second and third obtuse     marginal branches, normal sized first posterior lateral branch and a     normal sized posterior descending artery.  The first obtuse marginal     branch has a 50% stenosis proximally.  4. Right coronary artery is a small nondominant vessel.  The right coronary     artery appears normal.   IMPRESSIONS:  1. Normal left ventricular systolic function.  2. One-vessel coronary artery disease, characterized by a complex, tubular     90% stenosis in the proximal left anterior descending artery extending  across the diagonal branch of the septal perforator.  There is moderate,     but non-obstructive disease in the obtuse marginal branch.   RECOMMENDATION:  These findings were discussed with the patient.  It was  explained that there is significant disease in the proximal LAD involving a  diagonal branch.  After discussion of the risks and benefits of percutaneous  coronary intervention including possible side branch occlusion, the patient  opted to proceed with percutaneous coronary intervention.   PTA PROCEDURAL NOTE:  We utilized the preexisting 6-French sheath in the  right femoral artery.  Heparin and Integrilin were administered per  protocol.  We used a 6-French JL4 guiding catheter.  A BMW wire was advanced  under fluoroscopic guidance into the distal LAD.  We then performed PTCA of  the proximal LAD with a 2.5 x 15 mm Quantum balloon for two inflations, the  first to 8 atmospheres and the second to 12 atmospheres.   We then positioned a 3.0 x 18 mm Cypher drug eluting stent across the  diseased segment of vessel.  This stent did go across the origins of  the  diagonal branch and the septal perforator.  There was some difficulty in  advancing the stent due to calcification, but we were able to position it  adequately.  This stent was deployed at 12 atmospheres.  Angiographic images  at that point revealed significant improvement.  However, there was residual  narrowing within the stent due to significant calcification.  At that point,  the diagonal branch of the septal perforator remained patent.   We then went back in with a 3.0 x 15 mm Quantum balloon positioned in the  distal aspect of the stent inflating it to 14 atmospheres and in the  proximal aspect of the stent inflating it to 20 atmospheres.  Angiographic  images at that point revealed an excellent result of his stent site.  However, the diagonal branch was now 100% occluded with TIMI-0 flow.   In addition, just beyond the stent, there appeared to be an edge dissection,  which was relatively localized and small.  We advanced a PT-2 light support  wire through the side struts of the stent in an attempt to recanalize the  diagonal branch.  We actually did advance the stent into the diagonal branch  and it appeared to be in the correct location.  We advanced a 2.0 x 15 mm  Maverick balloon over the wire into the diagonal branch and it did pass very  easily.  We performed one inflation with this balloon to 6 atmospheres.  Angiographic images at that point, however, revealed persistence of  occlusion of the diagonal branch with no flow in the vessel.  Multiple doses  of nitroglycerin were administered and then we did begin to see some flow in  the diagonal branch and it was apparent that there was a significant spiral  dissection in the diagonal branch beginning at its origin.  The coronary  guide wire appeared most likely to be in the dissection plane.  We then  attempted to advance a Hi-Torque Floppy wire followed by an [employer] wire followed by a traverse wire.  However, none  of these were able to cross  the side struts of the stent into the true lumen of the diagonal branch.  We  then pulled back our PT-2 wire and attempted to redirect it into the true  lumen of the diagonal branch.  However, we were unsuccessful.  We therefore turned our attention back to the LAD.  We advanced a 3.0 x 13  mm Cypher drug eluting stent into the mid LAD to cover the edge dissection  from the initial stent.  There was overlap with the first stent.  We  deployed our stent at 9 atmospheres.  We then went in with a 3.0 x 15 mm  Quantum balloon, positioned it in the distal aspect of the stent inflating  it to 12 atmospheres and then pulled it back slightly and inflated it to 15  atmospheres.   Final angiographic images were obtained revealing patency to the LAD of the  stent site with 0% residual stenosis at the stent site and TIMI-3 flow into  the distal vessel.  There was TIMI-1 flow into the diagonal branch with  evidence of spiral dissection.  The patient was hemodynamically stable at  that point with minimal chest discomfort.   COMPLICATIONS:  Occlusion of the diagonal side branch, as described above.   RESULTS:  1. Successful placement of drug eluting stents times two in the proximal to     the mid left anterior descending artery.  2. A calcified tubular 90% stenosis was reduced to 0% residual with TIMI-3     flow.  3. As described above, the diagonal branch was occluded with TIMI-1 flow in     the diagonal branch at the conclusion of the procedure.   PLAN:  1. The patient will be continued on Integrilin for 18 hours.  It is     recommended that he be treated with Plavix for six to nine months.  2. We will cycle cardiac enzymes to assess his degree of myocardial damage     from loss of the side branch.  3. The patient will also require aggressive risk factor modification.                                               Carole Binning, M.D. [facility]    MWP/MEDQ  D:   09/06/2003  T:  09/06/2003  Job:  [id number]   cc:   Corrie Mckusick, M.D.  [address] [place] [place]  [place] [address]  Fax: [phone]   Willa Rough, M.D.   Cardiac Catheterization Laboratory

## 2010-11-01 NOTE — Assessment & Plan Note (Signed)
[facility]                            CARDIOLOGY OFFICE NOTE   BEAR, OSTEN                        MRN:          [id number]  DATE:09/01/2006                            DOB:          06/19/1945    Mr. Brandon Macias is seen for cardiology followup. He does have coronary disease.  He had an intervention to his LAD in 2005. In 2007, he was cleared to  have hip surgery and this was done. He is doing very well. He has not  had any chest pain. He is going about full activities. He is not having  any significant shortness of breath. He has had no syncope or  presyncope.   PAST MEDICAL HISTORY:   ALLERGIES:  No known drug allergies.   MEDICATIONS:  1. Vytorin.  2. Plavix 75.  3. Aspirin 81.  4. Lunesta.   OTHER MEDICAL PROBLEMS:  See the list below.   REVIEW OF SYSTEMS:  He knows he needs to lose some weight. He is not  having any significant symptoms. His review of systems otherwise is  negative.   PHYSICAL EXAMINATION:  VITAL SIGNS:  Weight is 193 pounds, blood  pressure is 132/84 with a pulse of 7.  GENERAL:  The patient is oriented to person, time and place. Affect is  normal.  HEENT:  There is no xanthelasma. He has normal extraocular motion.  NECK:  There are no carotid bruits. There is no jugular venous  distention.  CARDIAC:  Reveals an S1 with an S2. He does have a 3/6 crescendo-  decrescendo murmur at the left sternal border.  ABDOMEN:  Soft. There are no masses or bruits.  EXTREMITIES:  He has normal distal pulses. There is no peripheral edema.  MUSCULOSKELETAL:  No deformities.   EKG is normal. The patient did have a 2-D echo that was read by Dr. Kem Boroughs. He does have concentric left ventricular hypertrophy that is  mild to moderate. There is aortic sclerosis with very mild aortic  stenosis. The mitral valve is mildly thickened. There is normal left  ventricular function. There is a question of some mild increase in right  heart size with normal right ventricular function. The patient also had  carotid Dopplers in October 2007. There was no evidence of  hemodynamically significant stenosis.   PROBLEM LIST:  1. Question of borderline diabetes followed by Dr. Phillips Odor.  2. Increased weight and we talked about losing weight.  3. Abnormal cholesterol or Vytorin.  4. Status post hip surgery and redo hip surgery in January 2007.  5. Normal ejection fraction.  6. Coronary artery disease. The patient has tandem drug-eluting stents      to the LAD. There is a jailed diagonal and this was known. The      patient had a small controlled myocardial infarction at that time.      Repeat catheterization was done by Dr. Juanda Chance in January 2007. The      only finding was the occluded diagonal. The stents were widely      patent. His coronary disease will be  followed. He and I discussed      the pros and cons of continuing his Plavix greater than 3 years.      Because he has Tandem stents, we talked about this carefully. He      has decided that he would like to continue his Plavix and he will      take it every other day and I am in agreement with this. A Myoview      scan that showed some anterior ischemia in January when his stents      were patent. This could be related to his occluded diagonal.  7. Aortic valve sclerosis and very mild stenosis. This can be followed      by echo over the years. I explained this to him. No other medicine      changes would be recommended. I will follow this over the years.   Cardiac status is stable. He will try to lose weight and continue to  exercise, continue his cholesterol medicine. I will see him back in 1  year.     Brandon Abed, MD, [facility]  Electronically Signed    JDK/MedQ  DD: 09/01/2006  DT: 09/02/2006  Job #: [id number]   cc:   Corrie Mckusick, M.D.

## 2010-11-01 NOTE — Discharge Summary (Signed)
NAME:  ANTWAIN, CALIENDO                           ACCOUNT NO.:  [id number]   MEDICAL RECORD NO.:  [id number]                   PATIENT TYPE:  INP   LOCATION:  2008                                 FACILITY:  [facility]   PHYSICIAN:  Carole Binning, M.D. [facility]         DATE OF BIRTH:  [birth year]   DATE OF ADMISSION:  09/06/2003  DATE OF DISCHARGE:  09/08/2003                           DISCHARGE SUMMARY - REFERRING   DISCHARGE DIAGNOSES:  1. Anterior myocardial infarction.  2. Borderline diabetes mellitus with elevated glucose.  3. Family history of coronary artery disease.  4. Hyperlipidemia, treated.  5. Obesity.  6. Status post hip surgery to the left hip in the past.   HOSPITAL COURSE:  Mr. Dinovo is a 65 year old male patient who was seen in the  office on September 04, 2003, by Dr. Myrtis Ser with a strong family history of  coronary artery disease.  He had had some chest pain that was atypical and  as listed above has multiple risk factors.  The decision was made to proceed  with a Cardiolite scan.  This was performed on August 11, 2003 and read by  Dr. Cristy Hilts. Ganji.  The EKGs were felt to be diagnostic of ischemia.  He did  not have chest pain; however, he did have 1 mm horizontal ST segment  depression with stress.  He stressed for 5.5 minutes and then this  reappeared at 4 minutes into recovery.  The reperfusion imaging revealed a  moderate sized anterior/anteroseptal scar of ischemia with EF of 58%.  He  went to Dr. Myrtis Ser' office on September 04, 2003 for evaluation.  At that point  Dr. Myrtis Ser felt it was prudent that the patient be hospitalized for cardiac  catheterization.   It was arranged for cardiac catheterization on September 06, 2003.  The cardiac  catheterization revealed normal left main, LAD with a tubular 90% proximal  stenosis, circumflex with a first obtuse marginal 50% proximal stenosis, RCA  small, nondominant.  The patient was found to have one vessel coronary  artery disease and  then the patient underwent CYPHER stent placement x2 to  the LAD.  Plavix was recommended for 6-9 months.  Enzymes were cycled and  did elevate to a maximum troponin of 7.51.  His EKG does not reveal any  acute ST/T wave changes and normal sinus rhythm.   Other lab studies during the patient's hospitalization included sodium of  141, potassium 3.7, glucose 144, BUN 10, creatinine 1.1, hemoglobin 13.2,  hematocrit 37.2, white count 8.6, platelets 166.   The patient remained in the hospital for the next several days due to his  coronary intervention and elevated enzymes.  He tolerated the hospital stay  well and was felt to be ready for discharge on September 08, 2003.  He has  remained stable in normal sinus rhythm and will be discharged to home on the  following medications:   1. Vytorin,  as prior to admission.  2. Enteric coated aspirin 325 mg a day.  3. Plavix 75 mg a day.  (He will need to remain on this 6-9 months.)  4. Toprol XL 25 mg, one p.o. q.d.  5. Sublingual nitroglycerin p.r.n. chest pain.  6. Tylenol, 1-2 tablets every six hours as needed for pain.   No strenuous activity for two days and gradually increase activity.  He is  not to do heavy lifting or driving until seen by Dr. Myrtis Ser on September 21, 2003.  The office will call Mr. Biss on Monday with his appointment.  He also needs  to follow up with Dr. Corrie Mckusick regarding his increased glucose level.  He can call us for any further concerns or complaints.  Clean coronary cath  site with soap and water; no scrubbing.  Remain on a low fat diet.      Guy Franco, P.A. [facility]                      Carole Binning, M.D. [facility]    LB/MEDQ  D:  09/08/2003  T:  09/10/2003  Job:  [id number]   cc:   Willa Rough, M.D.   Corrie Mckusick, M.D.  [address] [place]ure[place]s. [place]  [place]  [place] [address]  Fax: [phone]

## 2010-11-01 NOTE — Op Note (Signed)
Brandon Macias, Brandon Macias NO.:  [id number]   MEDICAL RECORD NO.:  [id number]          PATIENT TYPE:  INP   LOCATION:  5023                         FACILITY:  [facility]   PHYSICIAN:  Mark C. Ophelia Charter, M.D.    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   PROCEDURE:  Left total hip arthroplasty revision, polyethylene and ball  exchange.   ANESTHESIA:  General plus local.   ESTIMATED BLOOD LOSS:  200 mL.   BRIEF HISTORY:  65 year old male had a total hip arthroplasty done by me in  1992 and now, 15 years later, he has got some eccentric polyethylene wear.  He is admitted for revision before he gets metal component loosening with  loosening at the bone prosthetic interface.  His original injury was  acetabular fracture with development of AVN, he had a fracture of the  humeral head, and had a total hip arthroplasty done in 1992.  He has done  well up until a couple of years ago when he was placed on anti-inflammatory  and Fosamax to prevent further bone resorption but now is having progressive  pain and has 4 mm of polyethylene wear.  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   DESCRIPTION OF PROCEDURE:  After induction of general anesthesia, the  patient was placed in the lateral position with axillary rolls, appropriate  padding, and positioning.  The posterior incision was used using the old  incision opening.  The piriformis was tagged.  There was abundant thick  capsule which required posterior capsulectomy.  Large chunks of rice body  type material consistent with foreign body debris was obtained, cultures  were obtained which were negative.   Gram stain was negative, as well.  Removal of soft tissue for exposure of the acetabulum was performed.  Poly  was removed and showed significant eccentric wear.  A new locking ring was  inserted after the head had been popped off.  A new ball was selected, there  was satisfactory hip stability.  The patient had a DePuy acetabular liner  which was 36 degree internal diameter with a +4 mm offset, had some  additional stability, and his ball was changed from a smaller to a larger  ball to increase his hip stability.  Duraloc locking ring, 6 mm, was used,  and the femoral head was 36 mm plus 5.  Polyethylene that was used was  [employer] polyethylene.  After satisfactory stability, the wound was  irrigated and closed in a standard fashion.  The tensor fascia was closed  with nonabsorbable,  0 Vicryl on the gluteus maximus, 2-0 in the  subcutaneous tissue, skin  stapled closed.  Postop dressing and knee immobilizer.  He appeared to have  been lengthened just a  few mm, but a portion of that was the polyethylene  wear and a portion of that was the slight offset to improve the stability  with the larger ball.      Mark C. Ophelia Charter, M.D.  Electronically Signed     MCY/MEDQ  D:  08/08/2005  T:  08/08/2005  Job:  [id number]

## 2010-12-16 ENCOUNTER — Encounter: Payer: Self-pay | Admitting: Cardiology

## 2011-01-28 ENCOUNTER — Ambulatory Visit: Payer: Self-pay | Admitting: Cardiology

## 2011-02-13 ENCOUNTER — Ambulatory Visit: Payer: Self-pay | Admitting: Cardiology

## 2011-05-06 ENCOUNTER — Ambulatory Visit: Payer: Self-pay | Admitting: Cardiology

## 2011-11-25 ENCOUNTER — Ambulatory Visit: Payer: Self-pay | Admitting: Cardiology

## 2011-12-01 ENCOUNTER — Ambulatory Visit: Payer: Self-pay | Admitting: Cardiology

## 2011-12-02 ENCOUNTER — Ambulatory Visit: Payer: Self-pay | Admitting: Cardiology

## 2011-12-02 ENCOUNTER — Encounter: Payer: Self-pay | Admitting: Cardiology

## 2011-12-02 DIAGNOSIS — E785 Hyperlipidemia, unspecified: Secondary | ICD-10-CM | POA: Insufficient documentation

## 2011-12-02 DIAGNOSIS — IMO0002 Reserved for concepts with insufficient information to code with codable children: Secondary | ICD-10-CM | POA: Insufficient documentation

## 2011-12-02 DIAGNOSIS — I2510 Atherosclerotic heart disease of native coronary artery without angina pectoris: Secondary | ICD-10-CM | POA: Insufficient documentation

## 2011-12-02 DIAGNOSIS — I358 Other nonrheumatic aortic valve disorders: Secondary | ICD-10-CM | POA: Insufficient documentation

## 2011-12-02 DIAGNOSIS — R9430 Abnormal result of cardiovascular function study, unspecified: Secondary | ICD-10-CM | POA: Insufficient documentation

## 2011-12-02 NOTE — H&P (Signed)
  NTS SOAP Note  Vital Signs:  Vitals as of: 12/02/2011: Systolic 121: Diastolic 62: Heart Rate 79: Temp 98.58F: Height 35ft 8in: Weight 162Lbs 0 Ounces: BMI 25  BMI : 24.63 kg/m2  Subjective: This 73 Years 49 Months old Male presents for screening TCS.  Denies any GI complaints.  No family h/o colon carcinoma.  Last TCS over 12 years ago.   Review of Symptoms:  Constitutional:unremarkable   Head:unremarkable    Eyes:unremarkable   Nose/Mouth/Throat:unremarkable Cardiovascular:  unremarkable   Respiratory:unremarkable   Gastrointestinal:  unremarkable   Genitourinary:unremarkable     Musculoskeletal:unremarkable   Skin:unremarkable Hematolgic/Lymphatic:unremarkable     Allergic/Immunologic:unremarkable     Past Medical History:    Reviewed   Past Medical History  Surgical History: left hip replacment, right tibial pinning, Coronary stent placement 2007 Medical Problems:  Diabetes, High Blood pressure, High cholesterol Allergies: nkda Medications: lunesta, janumet, creastor, losartin   Social History:Reviewed  Social History  Preferred Language: English ([place]) Race:  White Ethnicity: Not Hispanic / Latino Age: 82 Years 9 Months Marital Status:  M Alcohol:  No Recreational drug(s):  No   Smoking Status: Never smoker reviewed on 12/02/2011  Family History:  Reviewed   Family History  Is there a family history of:No family h/o colon cancer    Objective Information: General:  Well appearing, well nourished in no distress. Head:Atraumatic; no masses; no abnormalities Neck:  Supple without lymphadenopathy.  Heart:  RRR, no murmur Lungs:    CTA bilaterally, no wheezes, rhonchi, rales.  Breathing unlabored. Abdomen:Soft, NT/ND, no HSM, no masses.   deferred to procedure  Assessment:Need for screening TCS  Diagnosis &amp; Procedure: DiagnosisCode: V76.51, ProcedureCode: [address],     Plan:Scheduled for TCS on 12/09/11.   Patient Education:Alternative treatments to surgery were discussed with patient (and family).  Risks and benefits  of procedure were fully explained to the patient (and family) who gave informed consent. Patient/family questions were addressed.  Follow-up:Pending Surgery

## 2011-12-04 ENCOUNTER — Encounter (HOSPITAL_COMMUNITY): Payer: Self-pay | Admitting: Pharmacy Technician

## 2011-12-04 ENCOUNTER — Encounter: Payer: Self-pay | Admitting: Cardiology

## 2011-12-04 ENCOUNTER — Ambulatory Visit (INDEPENDENT_AMBULATORY_CARE_PROVIDER_SITE_OTHER): Payer: PRIVATE HEALTH INSURANCE | Admitting: Cardiology

## 2011-12-04 ENCOUNTER — Ambulatory Visit: Payer: Self-pay | Admitting: Cardiology

## 2011-12-04 VITALS — BP 102/66 | HR 80 | Wt 163.0 lb

## 2011-12-04 DIAGNOSIS — I359 Nonrheumatic aortic valve disorder, unspecified: Secondary | ICD-10-CM

## 2011-12-04 DIAGNOSIS — I2510 Atherosclerotic heart disease of native coronary artery without angina pectoris: Secondary | ICD-10-CM

## 2011-12-04 DIAGNOSIS — I358 Other nonrheumatic aortic valve disorders: Secondary | ICD-10-CM

## 2011-12-04 DIAGNOSIS — F329 Major depressive disorder, single episode, unspecified: Secondary | ICD-10-CM

## 2011-12-04 DIAGNOSIS — E785 Hyperlipidemia, unspecified: Secondary | ICD-10-CM

## 2011-12-04 DIAGNOSIS — F32A Depression, unspecified: Secondary | ICD-10-CM | POA: Insufficient documentation

## 2011-12-04 DIAGNOSIS — R0989 Other specified symptoms and signs involving the circulatory and respiratory systems: Secondary | ICD-10-CM

## 2011-12-04 NOTE — Patient Instructions (Addendum)
Your physician wants you to follow-up in: 1 year.   You will receive a reminder letter in the mail two months in advance. If you don't receive a letter, please call our office to schedule the follow-up appointment.  Your physician has requested that you have a carotid duplex. This test is an ultrasound of the carotid arteries in your neck. It looks at blood flow through these arteries that supply the brain with blood. Allow one hour for this exam. There are no restrictions or special instructions.  Your physician has requested that you have a stress echocardiogram. For further information please visit [email or web address] Please follow instruction sheet as given.

## 2011-12-04 NOTE — Assessment & Plan Note (Signed)
Patient has known coronary disease. Nuclear scan in the past was mildly abnormal related to his jailed diagonal. He's having some exertional symptoms. We need to proceed with a stress study. His resting EKG reveals nonspecific ST-T wave changes. We will obtain optimal information from a stress echo. He says that he is able to walk.

## 2011-12-04 NOTE — Assessment & Plan Note (Signed)
The patient has not had a carotid Doppler since before 2005. There is question of a right carotid bruit. This could be a radiated murmur. It is appropriate to proceed with a carotid Doppler to assess them fully. I will be in touch with him with the information.

## 2011-12-04 NOTE — Assessment & Plan Note (Signed)
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 His murmur is still soft. He does not need a followup complete echo at this time.

## 2011-12-04 NOTE — Assessment & Plan Note (Signed)
His lipids are being treated well. No change in therapy. 

## 2011-12-04 NOTE — Assessment & Plan Note (Signed)
He has had significant depression. Fortunately he is doing better at this time.

## 2011-12-04 NOTE — Progress Notes (Signed)
HPI The patient is seen for cardiology followup. He has known coronary disease. I saw him last September, 2011. He had tandem DES to the LAD in March 2 005. There is a jailed diagonal with a small controlled MI at that time. Repeat cath in January, 2007 showed that the old diagonal was occluded. The stents were patent. A Myoview at that time had a question some anterior ischemia that was probably from the old diagonal. Patient had an echo in 2010 revealing aortic valve sclerosis and normal ejection fraction.  He's had some mild positional dizziness. He also has been bothered by significant depression which is now better. 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 He may feel this in his neck. It does not sound like classic angina but it is concerning that it occurs with or after exertion.  No Known Allergies  Current Outpatient Prescriptions  Medication Sig Dispense Refill  . aspirin EC 81 MG tablet Take 81 mg by mouth daily.      . Eszopiclone (ESZOPICLONE) 3 MG TABS Take 3 mg by mouth at bedtime. Take immediately before bedtime      . losartan (COZAAR) 100 MG tablet Take 100 mg by mouth daily.        . mirtazapine (REMERON) 15 MG tablet Take 15 mg by mouth at bedtime.       . rosuvastatin (CRESTOR) 40 MG tablet Take 40 mg by mouth daily.        . sitaGLIPtan-metformin (JANUMET) 50-500 MG per tablet Take 1 tablet by mouth 2 (two) times daily with a meal.          History   Social History  . Marital Status: Married    Spouse Name: N/A    Number of Children: 2  . Years of Education: N/A   Occupational History  . retired    Social History Main Topics  . Smoking status: Never Smoker   . Smokeless tobacco: Not on file  . Alcohol Use: No  . Drug Use: No  . Sexually Active: Not on file   Other Topics Concern  . Not on file   Social History Narrative  . No narrative on file    Family History  Problem Relation Age  of Onset  . Heart disease      Past Medical History  Diagnosis Date  . Diabetes mellitus   . Hyperlipidemia   . Coronary artery disease     tandem DES to the LAD March 2005. Jailed diagonal at that time with small controlled MI. Repeat cath Jan 2007.. old occluded diagonal. Stents were patent. Myoview scan at that time revealed some anterior ischemia, from the old diagonal.  . Aortic valve sclerosis     Echo, July, 2010  . Ejection fraction     EF 60%, echo, July, 2010    Past Surgical History  Procedure Date  . Hip surgery 2007  . Coronary stent placement   . Rotator cuff repair 2007  . Melanoma excision 11/11    ROS   Patient denies fever, chills, headache, sweats, rash, change in vision, change in hearing, chest pain, cough, nausea vomiting, urinary symptoms. All other systems are reviewed and are negative.  PHYSICAL EXAM  Patient's oriented to person time and place. Affect is normal. He is here with his wife. He has lost weight. Some of this was planned and some of it is related to his depression.  There is no jugulovenous distention. Lungs are clear. Respiratory effort is nonlabored. There is question of a soft right carotid bruit. Cardiac exam her vitals S1 and S2. There is a soft systolic murmur. The abdomen is soft. Is no peripheral edema. There no musculoskeletal deformities. There are no skin rashes.  Filed Vitals:   12/04/11 1413  BP: 102/66  Pulse: 80  Weight: 163 lb (73.936 kg)   EKG is done today and reviewed by me. There sinus rhythm. There are  nonspecific ST-T wave changes.  ASSESSMENT & PLAN

## 2011-12-09 ENCOUNTER — Ambulatory Visit (HOSPITAL_COMMUNITY)
Admission: RE | Admit: 2011-12-09 | Discharge: 2011-12-09 | Disposition: A | Discharge status: Home / Self Care | Payer: PRIVATE HEALTH INSURANCE | Source: Ambulatory Visit | Attending: General Surgery | Admitting: General Surgery

## 2011-12-09 ENCOUNTER — Encounter (HOSPITAL_COMMUNITY)
Admission: RE | Disposition: A | Discharge status: Home / Self Care | Payer: Self-pay | Source: Ambulatory Visit | Attending: General Surgery

## 2011-12-09 ENCOUNTER — Encounter (HOSPITAL_COMMUNITY): Payer: Self-pay | Admitting: *Deleted

## 2011-12-09 DIAGNOSIS — E785 Hyperlipidemia, unspecified: Secondary | ICD-10-CM | POA: Insufficient documentation

## 2011-12-09 DIAGNOSIS — Z7982 Long term (current) use of aspirin: Secondary | ICD-10-CM | POA: Insufficient documentation

## 2011-12-09 DIAGNOSIS — E119 Type 2 diabetes mellitus without complications: Secondary | ICD-10-CM | POA: Insufficient documentation

## 2011-12-09 DIAGNOSIS — K5730 Diverticulosis of large intestine without perforation or abscess without bleeding: Secondary | ICD-10-CM | POA: Insufficient documentation

## 2011-12-09 DIAGNOSIS — Z79899 Other long term (current) drug therapy: Secondary | ICD-10-CM | POA: Insufficient documentation

## 2011-12-09 DIAGNOSIS — I10 Essential (primary) hypertension: Secondary | ICD-10-CM | POA: Insufficient documentation

## 2011-12-09 DIAGNOSIS — Z1211 Encounter for screening for malignant neoplasm of colon: Secondary | ICD-10-CM | POA: Insufficient documentation

## 2011-12-09 HISTORY — PX: COLONOSCOPY: SHX5424

## 2011-12-09 SURGERY — COLONOSCOPY
Anesthesia: Moderate Sedation

## 2011-12-09 MED ORDER — SODIUM CHLORIDE 0.45 % IV SOLN
Freq: Once | INTRAVENOUS | Status: AC
  Administered 2011-12-09: 08:00:00 via INTRAVENOUS

## 2011-12-09 MED ORDER — STERILE WATER FOR IRRIGATION IR SOLN
Status: DC | PRN
  Administered 2011-12-09: 09:00:00

## 2011-12-09 MED ORDER — MIDAZOLAM HCL 5 MG/5ML IJ SOLN
INTRAMUSCULAR | Status: DC | PRN
  Administered 2011-12-09: 4 mg via INTRAVENOUS
  Administered 2011-12-09: 1 mg via INTRAVENOUS

## 2011-12-09 MED ORDER — MIDAZOLAM HCL 5 MG/5ML IJ SOLN
INTRAMUSCULAR | Status: AC
  Filled 2011-12-09: qty 10

## 2011-12-09 MED ORDER — MEPERIDINE HCL 100 MG/ML IJ SOLN
INTRAMUSCULAR | Status: AC
  Filled 2011-12-09: qty 2

## 2011-12-09 MED ORDER — MEPERIDINE HCL 25 MG/ML IJ SOLN
INTRAMUSCULAR | Status: DC | PRN
  Administered 2011-12-09: 50 mg via INTRAVENOUS

## 2011-12-09 NOTE — Op Note (Signed)
[facility] [address] [place], [place]  [address]  COLONOSCOPY PROCEDURE REPORT  PATIENT:  Brandon Macias, Brandon Macias  MR#:  [id number] BIRTHDATE:  10/24/1945, 65 yrs. old  GENDER:  male ENDOSCOPIST:  Franky Macho, MD REF. BY:  Assunta Found, M.D. PROCEDURE DATE:  12/09/2011 PROCEDURE:  Average-risk screening colonoscopy G0121 ASA CLASS:  Class II INDICATIONS:  Screening MEDICATIONS:   Versed 5 mg IV, demerol mg IV  DESCRIPTION OF PROCEDURE:   After the risks benefits and alternatives of the procedure were thoroughly explained, informed consent was obtained.  Digital rectal exam was performed and revealed no abnormalities.   The EC-[id number] ([id number]) endoscope was introduced through the anus and advanced to the cecum, which was identified by both the appendix and ileocecal valve, without limitations.  The quality of the prep was adequate..  The instrument was then slowly withdrawn as the colon was fully examined.  FINDINGS:  Moderate diverticulosis was found in the descending colon.   Retroflexed views in the rectum revealed no abnormalities.  The scope was then withdrawn from the cecum and the procedure completed.  Colonoscopy  otherwise unremarkable. COMPLICATIONS:  None ENDOSCOPIC IMPRESSION: 1) Moderate diverticulosis in the descending colon 2) Normal colonoscopy otherwise RECOMMENDATIONS:  REPEAT EXAM:  In 10 year(s) for Colonoscopy.  ______________________________ Franky Macho, MD  CC:  Assunta Found, MD  n. Rosalie DoctorFranky Macho at 12/09/2011 08:49 AM  Mickie Hillier, [phone]

## 2011-12-09 NOTE — Interval H&P Note (Signed)
History and Physical Interval Note:  12/09/2011 8:14 AM  Brandon Macias  has presented today for surgery, with the diagnosis of Special screening for malignant neoplasms, colon   The various methods of treatment have been discussed with the patient and family. After consideration of risks, benefits and other options for treatment, the patient has consented to  Procedure(s) (LRB): COLONOSCOPY (N/A) as a surgical intervention .  The patient's history has been reviewed, patient examined, no change in status, stable for surgery.  I have reviewed the patients' chart and labs.  Questions were answered to the patient's satisfaction.     Franky Macho A

## 2011-12-09 NOTE — Discharge Instructions (Signed)
Colonoscopy Care After Read the instructions outlined below and refer to this sheet in the next few weeks. These discharge instructions provide you with general information on caring for yourself after you leave the hospital. Your doctor may also give you specific instructions. While your treatment has been planned according to the most current medical practices available, unavoidable complications occasionally occur. If you have any problems or questions after discharge, call your doctor. HOME CARE INSTRUCTIONS ACTIVITY:  You may resume your regular activity, but move at a slower pace for the next 24 hours.   Take frequent rest periods for the next 24 hours.   Walking will help get rid of the air and reduce the bloated feeling in your belly (abdomen).   No driving for 24 hours (because of the medicine (anesthesia) used during the test).   You may shower.   Do not sign any important legal documents or operate any machinery for 24 hours (because of the anesthesia used during the test).  NUTRITION:  Drink plenty of fluids.   You may resume your normal diet as instructed by your doctor.   Begin with a light meal and progress to your normal diet. Heavy or fried foods are harder to digest and may make you feel sick to your stomach (nauseated).   Avoid alcoholic beverages for 24 hours or as instructed.  MEDICATIONS:  You may resume your normal medications unless your doctor tells you otherwise.  WHAT TO EXPECT TODAY:  Some feelings of bloating in the abdomen.   Passage of more gas than usual.   Spotting of blood in your stool or on the toilet paper.  IF YOU HAD POLYPS REMOVED DURING THE COLONOSCOPY:  No aspirin products for 7 days or as instructed.   No alcohol for 7 days or as instructed.   Eat a soft diet for the next 24 hours.  FINDING OUT THE RESULTS OF YOUR TEST Not all test results are available during your visit. If your test results are not back during the visit, make an  appointment with your caregiver to find out the results. Do not assume everything is normal if you have not heard from your caregiver or the medical facility. It is important for you to follow up on all of your test results.  SEEK IMMEDIATE MEDICAL CARE IF:  You have more than a spotting of blood in your stool.   Your belly is swollen (abdominal distention).   You are nauseated or vomiting.   You have a fever.   You have abdominal pain or discomfort that is severe or gets worse throughout the day.  Document Released: 01/15/2004 Document Revised: 05/22/2011 Document Reviewed: 01/13/2008 [employer] Patient Information 2012 [place], [place].   Diverticulosis Diverticulosis is a common condition that develops when small pouches (diverticula) form in the wall of the colon. The risk of diverticulosis increases with age. It happens more often in people who eat a low-fiber diet. Most individuals with diverticulosis have no symptoms. Those individuals with symptoms usually experience abdominal pain, constipation, or loose stools (diarrhea). HOME CARE INSTRUCTIONS   Increase the amount of fiber in your diet as directed by your caregiver or dietician. This may reduce symptoms of diverticulosis.   Your caregiver may recommend taking a dietary fiber supplement.   Drink at least 6 to 8 glasses of water each day to prevent constipation.   Try not to strain when you have a bowel movement.   Your caregiver may recommend avoiding nuts and seeds to prevent  complications, although this is still an uncertain benefit.   Only take over-the-counter or prescription medicines for pain, discomfort, or fever as directed by your caregiver.  FOODS WITH HIGH FIBER CONTENT INCLUDE:  Fruits. Apple, peach, pear, tangerine, raisins, prunes.   Vegetables. Brussels sprouts, asparagus, broccoli, cabbage, carrot, cauliflower, romaine lettuce, spinach, summer squash, tomato, winter squash, zucchini.   Starchy Vegetables.  Baked beans, kidney beans, lima beans, split peas, lentils, potatoes (with skin).   Grains. Whole wheat bread, brown rice, bran flake cereal, plain oatmeal, Zackry Deines rice, shredded wheat, bran muffins.  SEEK IMMEDIATE MEDICAL CARE IF:   You develop increasing pain or severe bloating.   You have an oral temperature above 102 F (38.9 C), not controlled by medicine.   You develop vomiting or bowel movements that are bloody or black.  Document Released: 02/28/2004 Document Revised: 05/22/2011 Document Reviewed: 10/31/2009 [facility] Patient Information 2012 [place], [place].

## 2011-12-12 ENCOUNTER — Ambulatory Visit (HOSPITAL_COMMUNITY): Payer: PRIVATE HEALTH INSURANCE | Attending: Internal Medicine | Admitting: Radiology

## 2011-12-12 ENCOUNTER — Encounter (HOSPITAL_COMMUNITY): Payer: Self-pay | Admitting: General Surgery

## 2011-12-12 ENCOUNTER — Encounter: Payer: Self-pay | Admitting: Cardiology

## 2011-12-12 ENCOUNTER — Ambulatory Visit (HOSPITAL_COMMUNITY): Payer: PRIVATE HEALTH INSURANCE | Attending: Internal Medicine

## 2011-12-12 DIAGNOSIS — Z01810 Encounter for preprocedural cardiovascular examination: Secondary | ICD-10-CM

## 2011-12-12 DIAGNOSIS — I2510 Atherosclerotic heart disease of native coronary artery without angina pectoris: Secondary | ICD-10-CM | POA: Insufficient documentation

## 2011-12-12 DIAGNOSIS — R42 Dizziness and giddiness: Secondary | ICD-10-CM

## 2011-12-12 NOTE — Progress Notes (Signed)
Echocardiogram performed.  

## 2011-12-16 ENCOUNTER — Encounter: Payer: Self-pay | Admitting: Cardiology

## 2011-12-17 ENCOUNTER — Telehealth: Payer: Self-pay | Admitting: Cardiology

## 2011-12-17 NOTE — Telephone Encounter (Signed)
Pt was given results of his stress echo.

## 2011-12-17 NOTE — Telephone Encounter (Signed)
Fu call °Patient returning your call °

## 2011-12-23 ENCOUNTER — Encounter (INDEPENDENT_AMBULATORY_CARE_PROVIDER_SITE_OTHER): Payer: PRIVATE HEALTH INSURANCE

## 2011-12-23 DIAGNOSIS — R0989 Other specified symptoms and signs involving the circulatory and respiratory systems: Secondary | ICD-10-CM

## 2011-12-23 DIAGNOSIS — I6529 Occlusion and stenosis of unspecified carotid artery: Secondary | ICD-10-CM

## 2011-12-26 ENCOUNTER — Encounter: Payer: Self-pay | Admitting: Cardiology

## 2011-12-26 DIAGNOSIS — I739 Peripheral vascular disease, unspecified: Secondary | ICD-10-CM

## 2013-05-17 ENCOUNTER — Ambulatory Visit (HOSPITAL_COMMUNITY): Payer: Medicare Other | Attending: Cardiology

## 2013-05-17 DIAGNOSIS — I6529 Occlusion and stenosis of unspecified carotid artery: Secondary | ICD-10-CM | POA: Insufficient documentation

## 2013-05-20 ENCOUNTER — Encounter: Payer: Self-pay | Admitting: Cardiology

## 2015-05-17 DEATH — deceased
